# Patient Record
Sex: Female | Born: 1975 | Race: White | Hispanic: No | Marital: Married | Smoking: Never smoker
Health system: Southern US, Community
[De-identification: ages and names within clinical notes are randomized; demographics above are authoritative.]

## PROBLEM LIST (undated history)

## (undated) DIAGNOSIS — K9041 Non-celiac gluten sensitivity: Secondary | ICD-10-CM

## (undated) DIAGNOSIS — IMO0001 Reserved for inherently not codable concepts without codable children: Secondary | ICD-10-CM

## (undated) DIAGNOSIS — K219 Gastro-esophageal reflux disease without esophagitis: Secondary | ICD-10-CM

## (undated) DIAGNOSIS — J9383 Other pneumothorax: Secondary | ICD-10-CM

## (undated) DIAGNOSIS — K589 Irritable bowel syndrome without diarrhea: Secondary | ICD-10-CM

## (undated) DIAGNOSIS — Z8489 Family history of other specified conditions: Secondary | ICD-10-CM

## (undated) DIAGNOSIS — E079 Disorder of thyroid, unspecified: Secondary | ICD-10-CM

## (undated) HISTORY — PX: SALPINGECTOMY: SHX328

## (undated) HISTORY — DX: Non-celiac gluten sensitivity: K90.41

## (undated) HISTORY — DX: Reserved for inherently not codable concepts without codable children: IMO0001

## (undated) HISTORY — PX: HYSTEROSCOPY W/ ENDOMETRIAL ABLATION: SUR665

## (undated) HISTORY — DX: Disorder of thyroid, unspecified: E07.9

## (undated) HISTORY — PX: OTHER SURGICAL HISTORY: SHX169

## (undated) HISTORY — DX: Other pneumothorax: J93.83

## (undated) HISTORY — PX: HERNIA REPAIR: SHX51

## (undated) HISTORY — DX: Family history of other specified conditions: Z84.89

## (undated) HISTORY — PX: TONSILLECTOMY AND ADENOIDECTOMY: SUR1326

## (undated) HISTORY — DX: Irritable bowel syndrome, unspecified: K58.9

## (undated) HISTORY — DX: Gastro-esophageal reflux disease without esophagitis: K21.9

---

## 2008-10-28 ENCOUNTER — Encounter: Payer: Self-pay | Admitting: Cardiology

## 2008-10-28 LAB — CONVERTED CEMR LAB
Albumin: 4.5 g/dL
BUN: 15 mg/dL
CO2, serum: 26 mmol/L
Creatinine, Ser: 0.7 mg/dL
Free T4: 1.12 ng/dL
Hemoglobin: 13.3 g/dL
Potassium, serum: 4 mmol/L
Sodium, serum: 137 mmol/L
TSH: 0.5 microintl units/mL
Total Protein: 7.3 g/dL

## 2008-10-30 ENCOUNTER — Ambulatory Visit: Payer: Self-pay | Admitting: Cardiology

## 2008-10-30 DIAGNOSIS — R42 Dizziness and giddiness: Secondary | ICD-10-CM | POA: Insufficient documentation

## 2008-10-30 DIAGNOSIS — Z8639 Personal history of other endocrine, nutritional and metabolic disease: Secondary | ICD-10-CM

## 2008-10-30 DIAGNOSIS — Z862 Personal history of diseases of the blood and blood-forming organs and certain disorders involving the immune mechanism: Secondary | ICD-10-CM | POA: Insufficient documentation

## 2008-10-30 DIAGNOSIS — R002 Palpitations: Secondary | ICD-10-CM | POA: Insufficient documentation

## 2008-12-06 ENCOUNTER — Encounter: Payer: Self-pay | Admitting: Cardiology

## 2008-12-08 ENCOUNTER — Ambulatory Visit: Payer: Self-pay | Admitting: Cardiology

## 2009-01-07 ENCOUNTER — Ambulatory Visit: Payer: Self-pay | Admitting: Cardiology

## 2011-04-19 ENCOUNTER — Encounter: Payer: Self-pay | Admitting: Cardiology

## 2014-07-09 ENCOUNTER — Encounter (INDEPENDENT_AMBULATORY_CARE_PROVIDER_SITE_OTHER): Payer: Self-pay | Admitting: *Deleted

## 2014-08-03 ENCOUNTER — Telehealth: Payer: Self-pay | Admitting: *Deleted

## 2014-08-03 NOTE — Telephone Encounter (Signed)
Stated she was an old patient of Dr. Arlina Robes - was questioning medication (beta blocker) that he may have given her.  Stated she had been seeing Dr. Derenda Mis & he wanted to know the name.  Advised patient that he had never prescribed beta blocker per our documentation.  Informed her that she would be considered a new patient since last seen 2010.  She can call back if she chooses to re-establish.  Patient verbalized understanding.

## 2014-08-06 ENCOUNTER — Ambulatory Visit (INDEPENDENT_AMBULATORY_CARE_PROVIDER_SITE_OTHER): Payer: Self-pay | Admitting: Internal Medicine

## 2016-10-10 DIAGNOSIS — R5383 Other fatigue: Secondary | ICD-10-CM | POA: Diagnosis not present

## 2016-10-10 DIAGNOSIS — E039 Hypothyroidism, unspecified: Secondary | ICD-10-CM | POA: Diagnosis not present

## 2017-01-24 DIAGNOSIS — Z6824 Body mass index (BMI) 24.0-24.9, adult: Secondary | ICD-10-CM | POA: Diagnosis not present

## 2017-01-24 DIAGNOSIS — Z01419 Encounter for gynecological examination (general) (routine) without abnormal findings: Secondary | ICD-10-CM | POA: Diagnosis not present

## 2017-06-13 DIAGNOSIS — Z1283 Encounter for screening for malignant neoplasm of skin: Secondary | ICD-10-CM | POA: Diagnosis not present

## 2017-06-13 DIAGNOSIS — D225 Melanocytic nevi of trunk: Secondary | ICD-10-CM | POA: Diagnosis not present

## 2018-01-25 DIAGNOSIS — Z6824 Body mass index (BMI) 24.0-24.9, adult: Secondary | ICD-10-CM | POA: Diagnosis not present

## 2018-01-25 DIAGNOSIS — Z01419 Encounter for gynecological examination (general) (routine) without abnormal findings: Secondary | ICD-10-CM | POA: Diagnosis not present

## 2018-01-25 DIAGNOSIS — E039 Hypothyroidism, unspecified: Secondary | ICD-10-CM | POA: Diagnosis not present

## 2018-06-05 DIAGNOSIS — Z1283 Encounter for screening for malignant neoplasm of skin: Secondary | ICD-10-CM | POA: Diagnosis not present

## 2018-06-05 DIAGNOSIS — D225 Melanocytic nevi of trunk: Secondary | ICD-10-CM | POA: Diagnosis not present

## 2018-08-05 DIAGNOSIS — Z6825 Body mass index (BMI) 25.0-25.9, adult: Secondary | ICD-10-CM | POA: Diagnosis not present

## 2018-08-05 DIAGNOSIS — R1011 Right upper quadrant pain: Secondary | ICD-10-CM | POA: Diagnosis not present

## 2018-08-05 DIAGNOSIS — R112 Nausea with vomiting, unspecified: Secondary | ICD-10-CM | POA: Diagnosis not present

## 2018-08-05 DIAGNOSIS — K7689 Other specified diseases of liver: Secondary | ICD-10-CM | POA: Diagnosis not present

## 2018-08-05 DIAGNOSIS — E039 Hypothyroidism, unspecified: Secondary | ICD-10-CM | POA: Diagnosis not present

## 2018-08-06 ENCOUNTER — Other Ambulatory Visit (HOSPITAL_COMMUNITY): Payer: Self-pay | Admitting: Family Medicine

## 2018-08-06 DIAGNOSIS — R1011 Right upper quadrant pain: Secondary | ICD-10-CM

## 2018-08-08 ENCOUNTER — Ambulatory Visit (HOSPITAL_COMMUNITY)
Admission: RE | Admit: 2018-08-08 | Discharge: 2018-08-08 | Disposition: A | Discharge status: Home / Self Care | Payer: BLUE CROSS/BLUE SHIELD | Source: Ambulatory Visit | Attending: Family Medicine | Admitting: Family Medicine

## 2018-08-08 ENCOUNTER — Encounter (HOSPITAL_COMMUNITY): Payer: Self-pay

## 2018-08-08 DIAGNOSIS — R1011 Right upper quadrant pain: Secondary | ICD-10-CM | POA: Diagnosis not present

## 2018-08-08 DIAGNOSIS — R112 Nausea with vomiting, unspecified: Secondary | ICD-10-CM | POA: Diagnosis not present

## 2018-08-08 DIAGNOSIS — R109 Unspecified abdominal pain: Secondary | ICD-10-CM | POA: Diagnosis not present

## 2018-08-08 MED ORDER — TECHNETIUM TC 99M MEBROFENIN IV KIT
5.0000 | PACK | Freq: Once | INTRAVENOUS | Status: AC | PRN
Start: 2018-08-08 — End: 2018-08-08
  Administered 2018-08-08: 5.18 via INTRAVENOUS

## 2018-08-08 MED ORDER — SODIUM CHLORIDE 0.9% FLUSH
INTRAVENOUS | Status: AC
  Filled 2018-08-08: qty 50

## 2018-08-08 MED ORDER — SINCALIDE 5 MCG IJ SOLR
INTRAMUSCULAR | Status: AC
  Administered 2018-08-08: 1.23 ug via INTRAVENOUS
  Filled 2018-08-08: qty 5

## 2018-08-08 MED ORDER — STERILE WATER FOR INJECTION IJ SOLN
INTRAMUSCULAR | Status: AC
  Administered 2018-08-08: 1.23 mL via INTRAVENOUS
  Filled 2018-08-08: qty 10

## 2018-08-30 ENCOUNTER — Encounter: Payer: Self-pay | Admitting: Internal Medicine

## 2018-11-26 ENCOUNTER — Ambulatory Visit (INDEPENDENT_AMBULATORY_CARE_PROVIDER_SITE_OTHER): Payer: BLUE CROSS/BLUE SHIELD | Admitting: Nurse Practitioner

## 2018-11-26 ENCOUNTER — Encounter: Payer: Self-pay | Admitting: Nurse Practitioner

## 2018-11-26 DIAGNOSIS — K9041 Non-celiac gluten sensitivity: Secondary | ICD-10-CM | POA: Diagnosis not present

## 2018-11-26 DIAGNOSIS — K580 Irritable bowel syndrome with diarrhea: Secondary | ICD-10-CM

## 2018-11-26 DIAGNOSIS — K589 Irritable bowel syndrome without diarrhea: Secondary | ICD-10-CM | POA: Insufficient documentation

## 2018-11-26 NOTE — Progress Notes (Signed)
CC'D TO PCP °

## 2018-11-26 NOTE — Assessment & Plan Note (Signed)
Per the patient she was previously tested for celiac disease which was negative.  However, she cut gluten out of her diet during her most recent bout of significant ongoing abdominal pain, diarrhea, nausea, vomiting and this seems to help.  Essentially, by self-imposed elimination diet, she is diagnosed her self with gluten sensitivity.  Being that she is feeling better without gluten I recommend she continue to avoid.  Continue current medications.  The other possible explanation for a flare of her symptoms in October could be self-limiting viral gastroenteritis given that she had nausea, vomiting, diarrhea exacerbation, abdominal pain.  Recommend she continue her current medications, follow-up in 4 months.

## 2018-11-26 NOTE — Progress Notes (Signed)
Primary Care Physician:  Rory Percy, MD Primary Gastroenterologist:  Dr. Gala Romney  Chief Complaint  Patient presents with  . Abdominal Pain    has IBS  . Diarrhea    everyday    HPI:   Charlene Larson is a 43 y.o. female who presents on referral from primary care for abdominal pain.  Reviewed information provided with referral including office visit dated 08/05/2018 with complaints of abdominal pain.  Noted similar symptoms previous September but in the past month has had 4 episodes of severe abdominal pain, nausea, vomiting, diarrhea.  Pain after eating and no appetite, no fevers.  If she lies still pain will pass.  It is diffuse and radiating to the umbilicus.  She was prescribed Zofran for nausea.  Noted right upper quadrant abdominal pain with positive Murphy sign and concern of gallbladder disease so an abdominal ultrasound was ordered.  Brat diet recommended.  Other labs.  Follow-up as needed.  Strong 08/05/2018 found CMP essentially normal, TSH normal, white blood cell count upper limit normal at 10.0, normal hemoglobin of 14.4, CBC overall normal.  HIDA scan found in our system dated 08/08/2018 which found normal gallbladder ejection fraction at 92% (60-minute normal is greater than 40%).  The patient did note sharp pain with injection of CCK.  No history of colonoscopy or endoscopy in our system.  Today she states she's doing ok overall. Has abdominal pain, N/V in September/October of 2019. This resolved. Has chronic IBS. Has been tested for Celiac which was negative but does have Gluten sensitivity. Her September/October symptoms resolved when she cut out Gluten. Has flares of IBS-D and has Bentyl but only takes it if she is having significant problems. Has been taking IBGard regularly which she feels helps. Last couple days has had tenderness intermittently mid-abdomen and right-sided abdomen; otherwise no severe ongoing abdominal pain. No N/V since October. Denies hematochezia,  melena. Has intermittent low grade temp 99-100 degrees. Denies unintentional weight loss other than mild weight when cutting out Gluten. Currently has 3-4 bowel movements a day, typically has morning-time diarrhea then her stools normalize. Denies incontinence or nocturnal stooling. Denies chest pain, dyspnea, dizziness, lightheadedness, syncope, near syncope. Denies any other upper or lower GI symptoms.  Past Medical History:  Diagnosis Date  . Abdominal pain    intermittent; IBS-D  . FHx: allergies    pt does have allergies  . IBS (irritable bowel syndrome)    Diarrhea type  . Non-celiac gluten sensitivity   . Reflux   . Spontaneous pneumothorax    x2  . Thyroid disease     Past Surgical History:  Procedure Laterality Date  . D&C with miscarriage    . HERNIA REPAIR    . HYSTEROSCOPY W/ ENDOMETRIAL ABLATION    . repair of broken jaw    . right hand usrgery    . TONSILLECTOMY AND ADENOIDECTOMY      Current Outpatient Medications  Medication Sig Dispense Refill  . dicyclomine (BENTYL) 10 MG capsule Take 1 capsule by mouth as needed.    . fexofenadine (ALLEGRA) 180 MG tablet Take 180 mg by mouth daily.      . fluticasone (FLONASE) 50 MCG/ACT nasal spray Place 2 sprays into both nostrils daily.    Marland Kitchen levothyroxine (SYNTHROID) 75 MCG tablet Take 75 mcg by mouth. Mon, Wed, Fri    . levothyroxine (SYNTHROID, LEVOTHROID) 50 MCG tablet Take 50 mcg by mouth. Tue, Thu, Sat, Sun    . Lysine 500 MG  CAPS Take by mouth daily.      . Multiple Vitamins-Minerals (MULTIVITAL) tablet Take 1 tablet by mouth daily.      Marland Kitchen Peppermint Oil (IBGARD PO) Take by mouth daily.    . Probiotic Product (PROBIOTIC DAILY PO) Take by mouth daily. 50 billion     No current facility-administered medications for this visit.     Allergies as of 11/26/2018 - Review Complete 11/26/2018  Allergen Reaction Noted  . Rice  11/26/2018  . Itraconazole  10/30/2008    Family History  Problem Relation Age of Onset  .  Obesity Mother   . Asthma Mother   . Colon cancer Maternal Great-grandmother     Social History   Socioeconomic History  . Marital status: Married    Spouse name: Not on file  . Number of children: Not on file  . Years of education: Not on file  . Highest education level: Not on file  Occupational History  . Not on file  Social Needs  . Financial resource strain: Not on file  . Food insecurity:    Worry: Not on file    Inability: Not on file  . Transportation needs:    Medical: Not on file    Non-medical: Not on file  Tobacco Use  . Smoking status: Never Smoker  . Smokeless tobacco: Never Used  Substance and Sexual Activity  . Alcohol use: No  . Drug use: Never  . Sexual activity: Not on file  Lifestyle  . Physical activity:    Days per week: Not on file    Minutes per session: Not on file  . Stress: Not on file  Relationships  . Social connections:    Talks on phone: Not on file    Gets together: Not on file    Attends religious service: Not on file    Active member of club or organization: Not on file    Attends meetings of clubs or organizations: Not on file    Relationship status: Not on file  . Intimate partner violence:    Fear of current or ex partner: Not on file    Emotionally abused: Not on file    Physically abused: Not on file    Forced sexual activity: Not on file  Other Topics Concern  . 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     Review of Systems: General: Negative for anorexia, weight loss, fever, chills, fatigue, weakness. ENT: Negative for hoarseness, difficulty swallowing. CV: Negative for chest pain, angina, palpitations, peripheral edema.  Respiratory: Negative for dyspnea at rest, cough, sputum, wheezing.  GI: See history of present illness. MS: Negative for joint pain, low back pain.  Derm: Negative for rash or itching.  Endo: Negative for unusual weight change.  Heme: Negative for bruising  or bleeding. Allergy: Negative for rash or hives.    Physical Exam: BP 115/74   Pulse 84   Temp (!) 97 F (36.1 C) (Oral)   Ht 5\' 4"  (1.626 m)   Wt 141 lb 9.6 oz (64.2 kg)   BMI 24.31 kg/m  General:   Alert and oriented. Pleasant and cooperative. Well-nourished and well-developed.  Head:  Normocephalic and atraumatic. Eyes:  Without icterus, sclera clear and conjunctiva pink.  Ears:  Normal auditory acuity. Cardiovascular:  S1, S2 present without murmurs appreciated. Extremities without clubbing or edema. Respiratory:  Clear to auscultation bilaterally. No wheezes, rales, or rhonchi. No distress.  Gastrointestinal:  +BS, soft, and non-distended. Mild mid-abdominal TTP. No HSM noted. No guarding or rebound. No masses appreciated.  Rectal:  Deferred  Musculoskalatal:  Symmetrical without gross deformities. Skin:  Intact without significant lesions or rashes. Neurologic:  Alert and oriented x4;  grossly normal neurologically. Psych:  Alert and cooperative. Normal mood and affect. Heme/Lymph/Immune: No excessive bruising noted.    11/26/2018 9:04 AM   Disclaimer: This note was dictated with voice recognition software. Similar sounding words can inadvertently be transcribed and may not be corrected upon review.

## 2018-11-26 NOTE — Patient Instructions (Signed)
Your health issues we discussed today were:   Gluten sensitivity: 1. I am glad you are feeling better after eliminating gluten. 2. Even though your celiac screening was negative, if eliminating gluten helps her symptoms then continue to avoid gluten  Irritable bowel syndrome diarrhea type: 1. I am glad you are feeling better on IBgard. 2. You can continue to use Bentyl as needed. 3. If you have worsening symptoms and need additional medications we can discuss other options including Viberzi  Overall I recommend:  1. Continue your current medications 2. Follow-up in 4 months 3. Call us if you have any questions or concerns.  At [facility] we value your feedback. You may receive a survey about your visit today. Please share your experience as we strive to create trusting relationships with our patients to provide genuine, compassionate, quality care.  We appreciate your understanding and patience as we review any laboratory studies, imaging, and other diagnostic tests that are ordered as we care for you. Our office policy is 5 business days for review of these results, and any emergent or urgent results are addressed in a timely manner for your best interest. If you do not hear from our office in 1 week, please contact us.   We also encourage the use of MyChart, which contains your medical information for your review as well. If you are not enrolled in this feature, an access code is on this after visit summary for your convenience. Thank you for allowing [place] to be involved in your care.  It was great to see you today!  I hope you have a great day!!

## 2018-11-26 NOTE — Assessment & Plan Note (Signed)
The patient has a history of IBS diarrhea type.  She has Bentyl on hand that she only uses if she is having significant symptoms.  Otherwise she prefers to treat with IBgard which she feels has helped her symptoms a lot.  She does have about 2-4 stools a day typically diarrhea in the morning and then normal stools later in the day.  Intermittent abdominal pain.  Her previous bout of abdominal pain has resolved and thinks it was related to gluten, which she is cut out.  At this point she could be a candidate for Viberzi if additional options are needed.  Otherwise, continue current medications and call us for any worsening symptoms.  Follow-up in 4 months.

## 2018-12-04 DIAGNOSIS — L820 Inflamed seborrheic keratosis: Secondary | ICD-10-CM | POA: Diagnosis not present

## 2018-12-04 DIAGNOSIS — Z1283 Encounter for screening for malignant neoplasm of skin: Secondary | ICD-10-CM | POA: Diagnosis not present

## 2018-12-04 DIAGNOSIS — L298 Other pruritus: Secondary | ICD-10-CM | POA: Diagnosis not present

## 2018-12-04 DIAGNOSIS — L821 Other seborrheic keratosis: Secondary | ICD-10-CM | POA: Diagnosis not present

## 2018-12-04 DIAGNOSIS — D225 Melanocytic nevi of trunk: Secondary | ICD-10-CM | POA: Diagnosis not present

## 2018-12-10 DIAGNOSIS — R899 Unspecified abnormal finding in specimens from other organs, systems and tissues: Secondary | ICD-10-CM | POA: Diagnosis not present

## 2018-12-10 DIAGNOSIS — Z6825 Body mass index (BMI) 25.0-25.9, adult: Secondary | ICD-10-CM | POA: Diagnosis not present

## 2018-12-19 DIAGNOSIS — M6281 Muscle weakness (generalized): Secondary | ICD-10-CM | POA: Diagnosis not present

## 2018-12-19 DIAGNOSIS — R5382 Chronic fatigue, unspecified: Secondary | ICD-10-CM | POA: Diagnosis not present

## 2018-12-19 DIAGNOSIS — R768 Other specified abnormal immunological findings in serum: Secondary | ICD-10-CM | POA: Diagnosis not present

## 2019-03-27 ENCOUNTER — Ambulatory Visit: Payer: BLUE CROSS/BLUE SHIELD | Admitting: Nurse Practitioner

## 2019-07-07 DIAGNOSIS — Z23 Encounter for immunization: Secondary | ICD-10-CM | POA: Diagnosis not present

## 2019-09-09 DIAGNOSIS — Z6825 Body mass index (BMI) 25.0-25.9, adult: Secondary | ICD-10-CM | POA: Diagnosis not present

## 2019-09-09 DIAGNOSIS — E039 Hypothyroidism, unspecified: Secondary | ICD-10-CM | POA: Diagnosis not present

## 2019-09-09 DIAGNOSIS — Z01419 Encounter for gynecological examination (general) (routine) without abnormal findings: Secondary | ICD-10-CM | POA: Diagnosis not present

## 2019-09-09 DIAGNOSIS — E559 Vitamin D deficiency, unspecified: Secondary | ICD-10-CM | POA: Diagnosis not present

## 2019-11-12 DIAGNOSIS — H6591 Unspecified nonsuppurative otitis media, right ear: Secondary | ICD-10-CM | POA: Diagnosis not present

## 2019-12-02 DIAGNOSIS — E559 Vitamin D deficiency, unspecified: Secondary | ICD-10-CM | POA: Diagnosis not present

## 2020-04-28 DIAGNOSIS — Z6826 Body mass index (BMI) 26.0-26.9, adult: Secondary | ICD-10-CM | POA: Diagnosis not present

## 2020-04-28 DIAGNOSIS — R42 Dizziness and giddiness: Secondary | ICD-10-CM | POA: Diagnosis not present

## 2020-04-28 DIAGNOSIS — E611 Iron deficiency: Secondary | ICD-10-CM | POA: Diagnosis not present

## 2020-04-28 DIAGNOSIS — E039 Hypothyroidism, unspecified: Secondary | ICD-10-CM | POA: Diagnosis not present

## 2020-04-28 DIAGNOSIS — H6121 Impacted cerumen, right ear: Secondary | ICD-10-CM | POA: Diagnosis not present

## 2020-08-10 DIAGNOSIS — Z23 Encounter for immunization: Secondary | ICD-10-CM | POA: Diagnosis not present

## 2020-09-13 DIAGNOSIS — Z01419 Encounter for gynecological examination (general) (routine) without abnormal findings: Secondary | ICD-10-CM | POA: Diagnosis not present

## 2020-09-13 DIAGNOSIS — E039 Hypothyroidism, unspecified: Secondary | ICD-10-CM | POA: Diagnosis not present

## 2020-09-13 DIAGNOSIS — E559 Vitamin D deficiency, unspecified: Secondary | ICD-10-CM | POA: Diagnosis not present

## 2020-09-13 DIAGNOSIS — Z6825 Body mass index (BMI) 25.0-25.9, adult: Secondary | ICD-10-CM | POA: Diagnosis not present

## 2020-09-30 ENCOUNTER — Other Ambulatory Visit: Payer: Self-pay | Admitting: Obstetrics and Gynecology

## 2020-09-30 DIAGNOSIS — Z1231 Encounter for screening mammogram for malignant neoplasm of breast: Secondary | ICD-10-CM

## 2020-10-12 ENCOUNTER — Ambulatory Visit: Payer: BLUE CROSS/BLUE SHIELD

## 2020-11-18 ENCOUNTER — Other Ambulatory Visit: Payer: Self-pay

## 2020-11-18 ENCOUNTER — Ambulatory Visit
Admission: RE | Admit: 2020-11-18 | Discharge: 2020-11-18 | Disposition: A | Discharge status: Home / Self Care | Payer: BC Managed Care – PPO | Source: Ambulatory Visit | Attending: Obstetrics and Gynecology | Admitting: Obstetrics and Gynecology

## 2020-11-18 DIAGNOSIS — Z1231 Encounter for screening mammogram for malignant neoplasm of breast: Secondary | ICD-10-CM

## 2020-11-22 ENCOUNTER — Other Ambulatory Visit: Payer: Self-pay | Admitting: Obstetrics and Gynecology

## 2020-11-22 DIAGNOSIS — R928 Other abnormal and inconclusive findings on diagnostic imaging of breast: Secondary | ICD-10-CM

## 2020-12-03 ENCOUNTER — Other Ambulatory Visit: Payer: Self-pay

## 2020-12-03 ENCOUNTER — Ambulatory Visit
Admission: RE | Admit: 2020-12-03 | Discharge: 2020-12-03 | Disposition: A | Discharge status: Home / Self Care | Payer: BC Managed Care – PPO | Source: Ambulatory Visit | Attending: Obstetrics and Gynecology | Admitting: Obstetrics and Gynecology

## 2020-12-03 ENCOUNTER — Other Ambulatory Visit: Payer: Self-pay | Admitting: Obstetrics and Gynecology

## 2020-12-03 DIAGNOSIS — R928 Other abnormal and inconclusive findings on diagnostic imaging of breast: Secondary | ICD-10-CM

## 2021-06-03 ENCOUNTER — Other Ambulatory Visit: Payer: Self-pay | Admitting: Obstetrics and Gynecology

## 2021-06-03 ENCOUNTER — Ambulatory Visit
Admission: RE | Admit: 2021-06-03 | Discharge: 2021-06-03 | Disposition: A | Discharge status: Home / Self Care | Payer: BC Managed Care – PPO | Source: Ambulatory Visit | Attending: Obstetrics and Gynecology | Admitting: Obstetrics and Gynecology

## 2021-06-03 ENCOUNTER — Other Ambulatory Visit: Payer: Self-pay

## 2021-06-03 DIAGNOSIS — R928 Other abnormal and inconclusive findings on diagnostic imaging of breast: Secondary | ICD-10-CM

## 2021-07-13 DIAGNOSIS — E559 Vitamin D deficiency, unspecified: Secondary | ICD-10-CM | POA: Diagnosis not present

## 2021-07-13 DIAGNOSIS — E039 Hypothyroidism, unspecified: Secondary | ICD-10-CM | POA: Diagnosis not present

## 2021-07-13 DIAGNOSIS — R5383 Other fatigue: Secondary | ICD-10-CM | POA: Diagnosis not present

## 2021-09-14 DIAGNOSIS — R946 Abnormal results of thyroid function studies: Secondary | ICD-10-CM | POA: Diagnosis not present

## 2021-09-14 DIAGNOSIS — R5383 Other fatigue: Secondary | ICD-10-CM | POA: Diagnosis not present

## 2021-09-14 DIAGNOSIS — Z793 Long term (current) use of hormonal contraceptives: Secondary | ICD-10-CM | POA: Diagnosis not present

## 2021-09-14 DIAGNOSIS — Z6825 Body mass index (BMI) 25.0-25.9, adult: Secondary | ICD-10-CM | POA: Diagnosis not present

## 2021-09-14 DIAGNOSIS — Z01419 Encounter for gynecological examination (general) (routine) without abnormal findings: Secondary | ICD-10-CM | POA: Diagnosis not present

## 2021-09-14 DIAGNOSIS — E039 Hypothyroidism, unspecified: Secondary | ICD-10-CM | POA: Diagnosis not present

## 2021-12-09 ENCOUNTER — Ambulatory Visit
Admission: RE | Admit: 2021-12-09 | Discharge: 2021-12-09 | Disposition: A | Discharge status: Home / Self Care | Payer: BC Managed Care – PPO | Source: Ambulatory Visit | Attending: Obstetrics and Gynecology | Admitting: Obstetrics and Gynecology

## 2021-12-09 ENCOUNTER — Other Ambulatory Visit: Payer: Self-pay

## 2021-12-09 DIAGNOSIS — R928 Other abnormal and inconclusive findings on diagnostic imaging of breast: Secondary | ICD-10-CM

## 2021-12-09 DIAGNOSIS — R922 Inconclusive mammogram: Secondary | ICD-10-CM | POA: Diagnosis not present

## 2022-01-13 DIAGNOSIS — M2550 Pain in unspecified joint: Secondary | ICD-10-CM | POA: Diagnosis not present

## 2022-01-13 DIAGNOSIS — S30860A Insect bite (nonvenomous) of lower back and pelvis, initial encounter: Secondary | ICD-10-CM | POA: Diagnosis not present

## 2022-01-13 DIAGNOSIS — I10 Essential (primary) hypertension: Secondary | ICD-10-CM | POA: Diagnosis not present

## 2022-01-13 DIAGNOSIS — E039 Hypothyroidism, unspecified: Secondary | ICD-10-CM | POA: Diagnosis not present

## 2022-01-13 DIAGNOSIS — N926 Irregular menstruation, unspecified: Secondary | ICD-10-CM | POA: Diagnosis not present

## 2022-01-13 DIAGNOSIS — R5383 Other fatigue: Secondary | ICD-10-CM | POA: Diagnosis not present

## 2022-04-03 DIAGNOSIS — J069 Acute upper respiratory infection, unspecified: Secondary | ICD-10-CM | POA: Diagnosis not present

## 2022-04-03 DIAGNOSIS — Z6826 Body mass index (BMI) 26.0-26.9, adult: Secondary | ICD-10-CM | POA: Diagnosis not present

## 2022-04-03 DIAGNOSIS — J029 Acute pharyngitis, unspecified: Secondary | ICD-10-CM | POA: Diagnosis not present

## 2022-06-06 ENCOUNTER — Other Ambulatory Visit: Payer: Self-pay | Admitting: Obstetrics and Gynecology

## 2022-06-06 DIAGNOSIS — R921 Mammographic calcification found on diagnostic imaging of breast: Secondary | ICD-10-CM

## 2022-06-16 DIAGNOSIS — Z23 Encounter for immunization: Secondary | ICD-10-CM | POA: Diagnosis not present

## 2022-07-25 DIAGNOSIS — R109 Unspecified abdominal pain: Secondary | ICD-10-CM | POA: Diagnosis not present

## 2022-07-25 DIAGNOSIS — K589 Irritable bowel syndrome without diarrhea: Secondary | ICD-10-CM | POA: Diagnosis not present

## 2022-07-25 DIAGNOSIS — Z6825 Body mass index (BMI) 25.0-25.9, adult: Secondary | ICD-10-CM | POA: Diagnosis not present

## 2022-07-25 DIAGNOSIS — Z23 Encounter for immunization: Secondary | ICD-10-CM | POA: Diagnosis not present

## 2022-07-25 DIAGNOSIS — K625 Hemorrhage of anus and rectum: Secondary | ICD-10-CM | POA: Diagnosis not present

## 2022-08-08 IMAGING — MG DIGITAL DIAGNOSTIC BILAT W/ TOMO W/ CAD
8 of 17 series · 8 of 33 positions shown · non-contrast
Comparison: Recent screening mammography

CLINICAL DATA: The patient was called back for multiple groups of
bilateral breast calcifications and multiple bilateral breast
masses.

EXAM:
DIGITAL DIAGNOSTIC BILATERAL MAMMOGRAM WITH TOMOSYNTHESIS AND CAD;
ULTRASOUND LEFT BREAST LIMITED; ULTRASOUND RIGHT BREAST LIMITED
TECHNIQUE: Bilateral digital diagnostic mammography and breast tomosynthesis
was performed. The images were evaluated with computer-aided
detection.; Targeted ultrasound examination of the left breast was
performed. ; Targeted ultrasound examination of the right breast was
performed.

[L ML (1 of 3)]
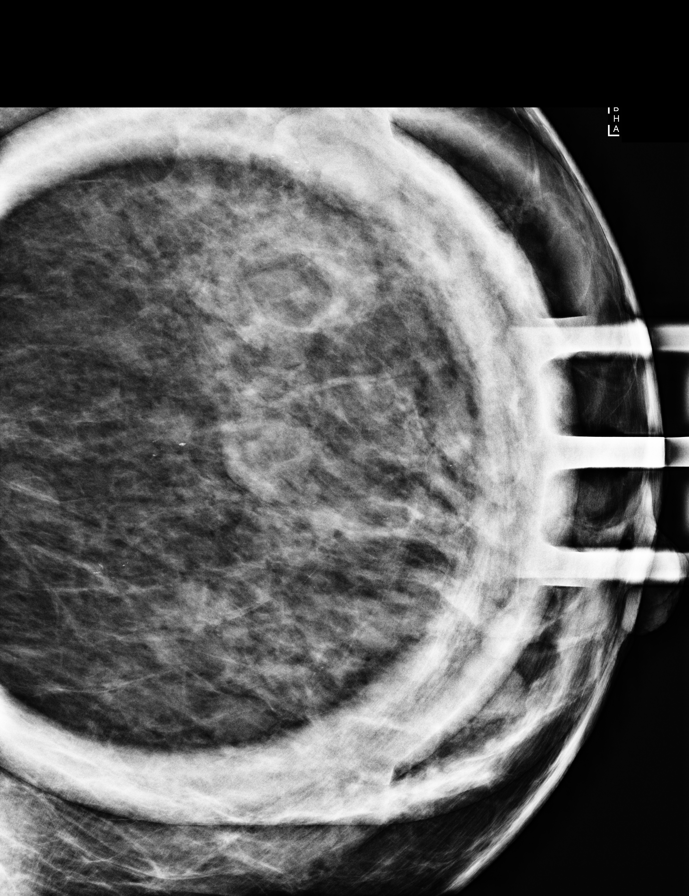

[L CC (1 of 2)]
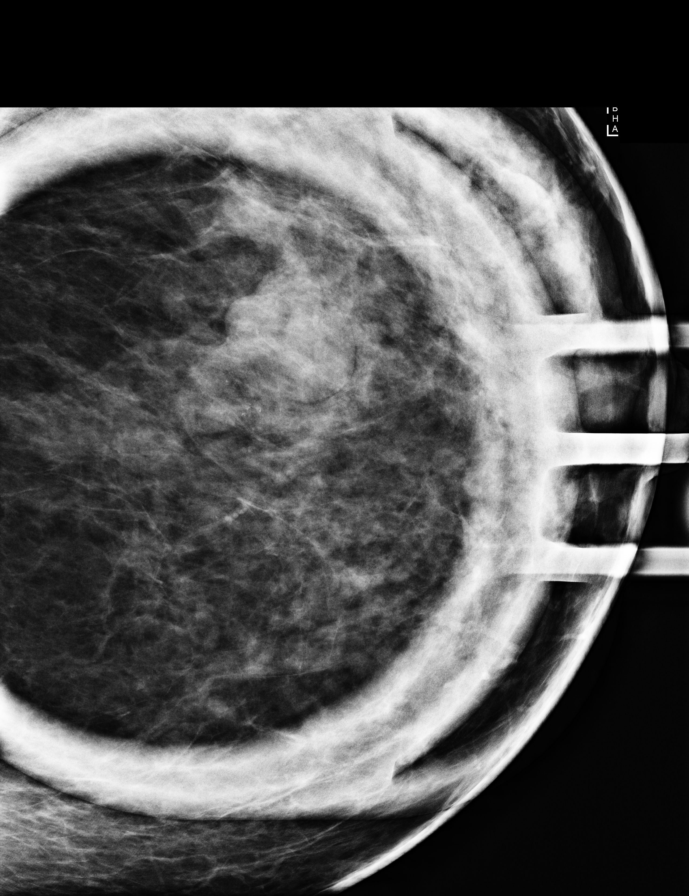

[L ML (2 of 3)]
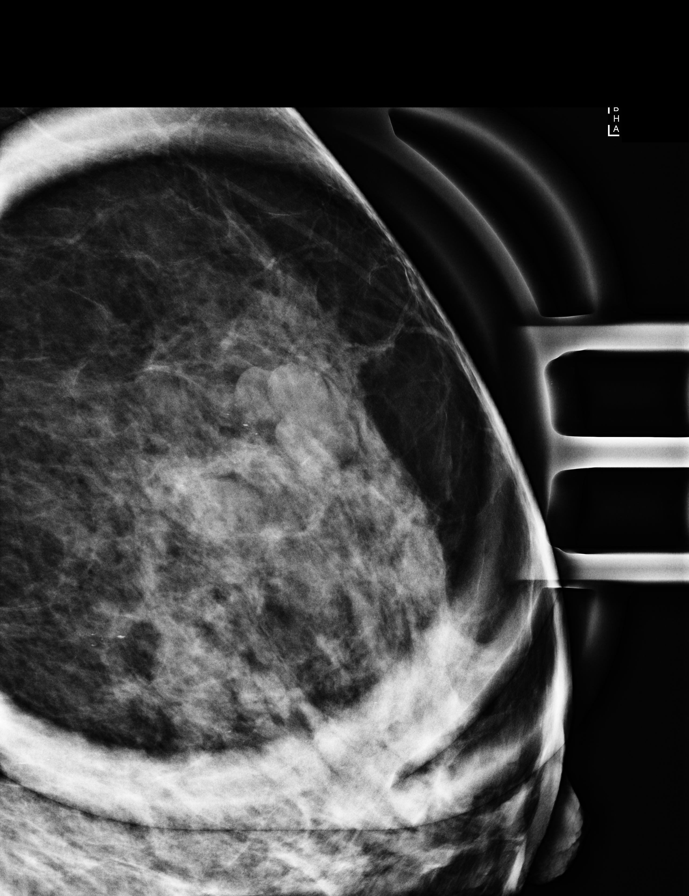

[R CC (1 of 2)]
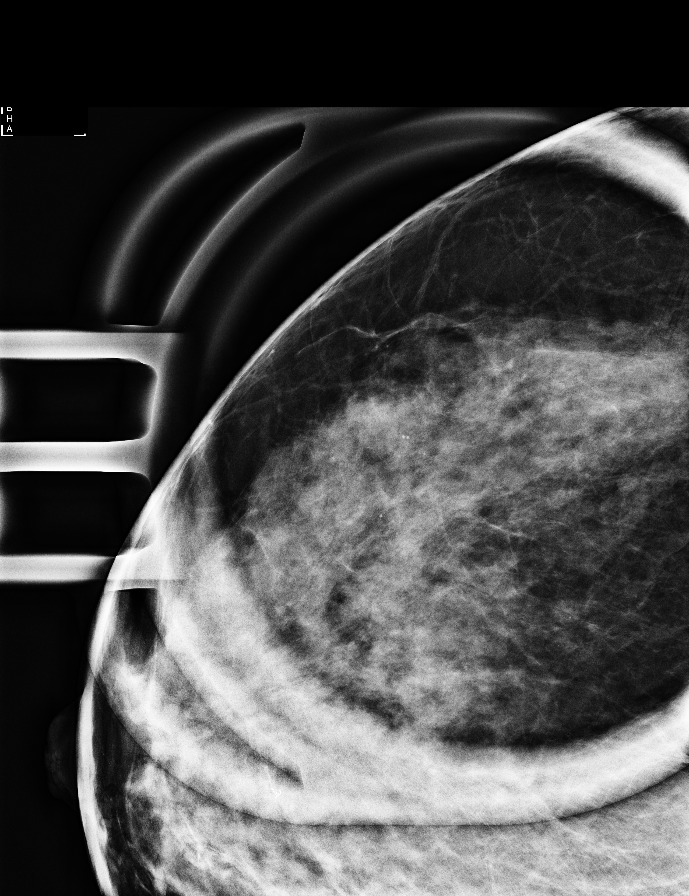

[R ML]
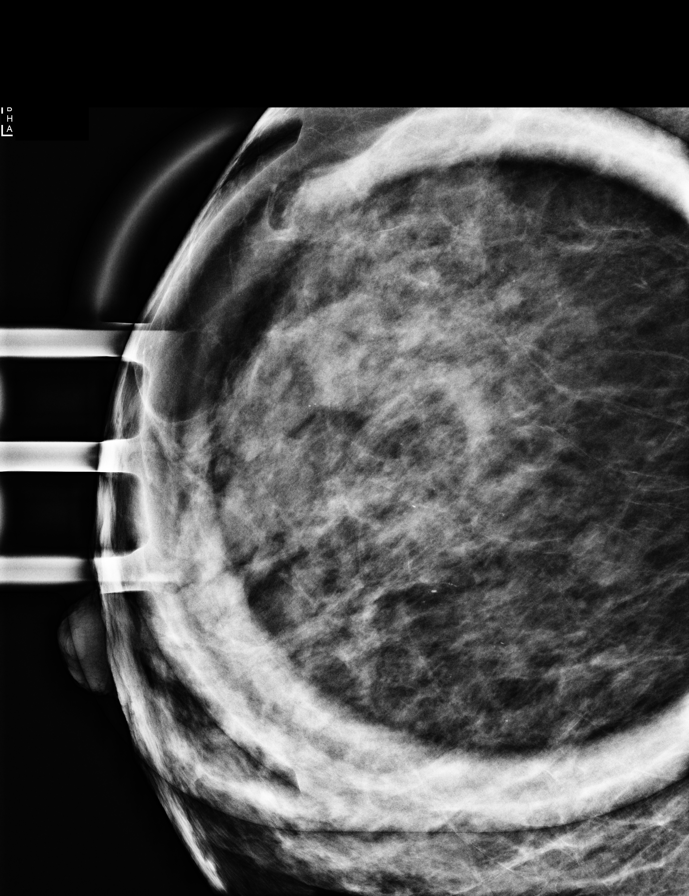

[L ML (3 of 3)]
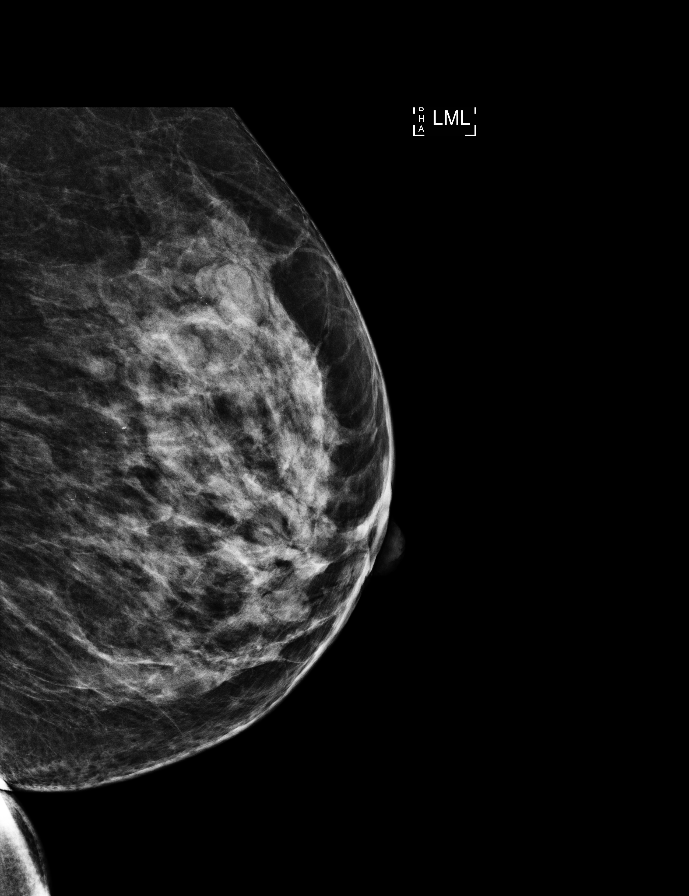

[L CC (2 of 2)]
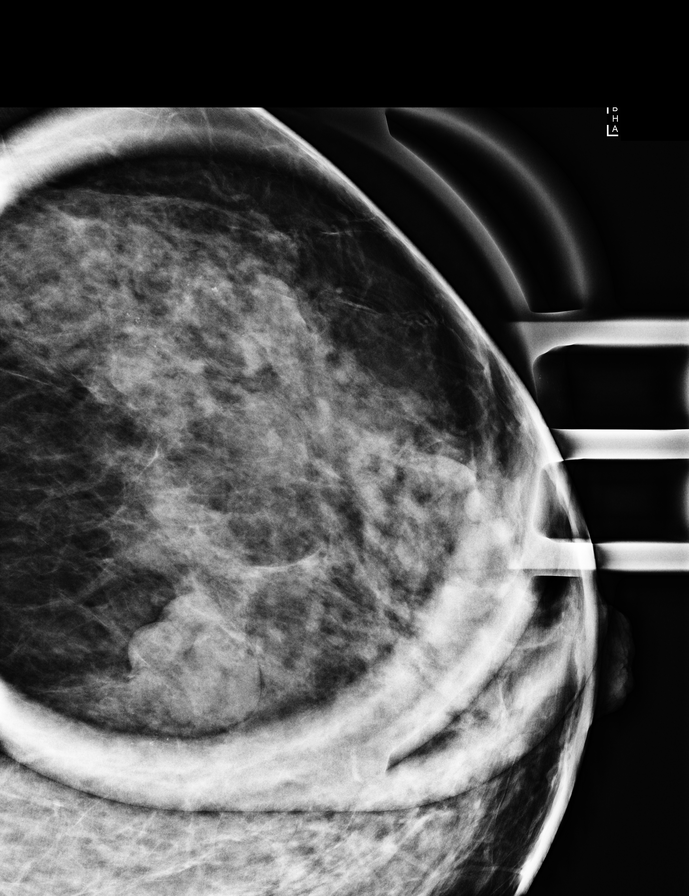

[R CC (2 of 2)]
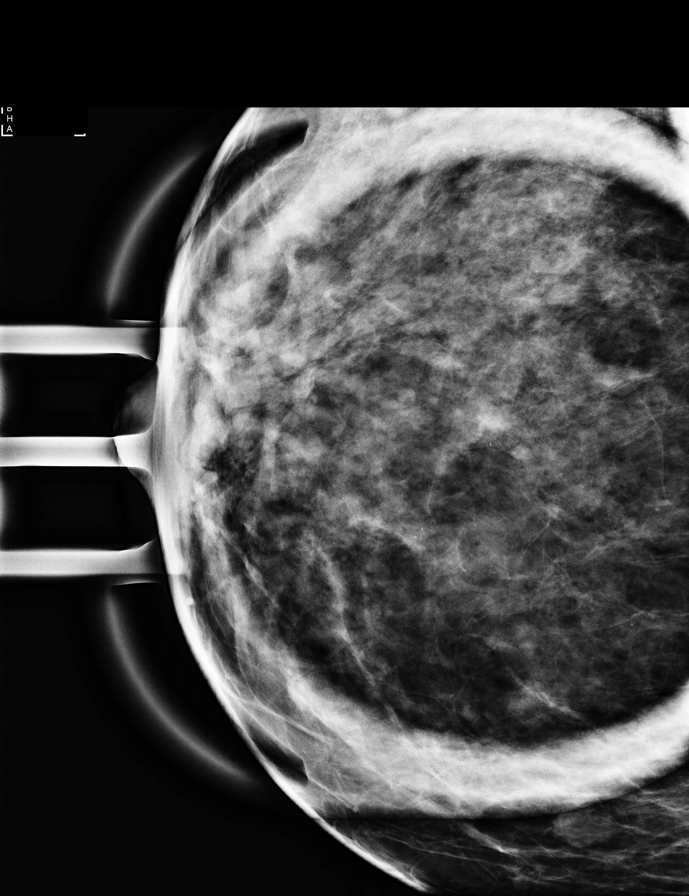

[8 of 33 positions shown; findings below may reference images not displayed]

ACR Breast Density Category c: The breast tissue is heterogeneously
dense, which may obscure small masses.
FINDINGS: The calcifications in both the lateral and medial aspects of the
right breast are probably benign. Some of these calcifications
appear to layer. There are 2 masses in the medial right breast which
are isodense and well-circumscribed.

The calcifications in the lateral and central left breast are
probably benign. Some of these calcifications appear to layer. There
are multiple masses in the left breast located from [DATE] to [DATE].

On physical exam, no suspicious lumps are identified.

Targeted ultrasound is performed, showing 2 masses in the right
breast. The hypoechoic mass at 2 o'clock, 6 cm from the nipple
measures 8 by 6 x 3 mm, likely a fibroadenoma. There is also a
simple cyst at 3 o'clock.

Numerous cysts are seen throughout the left breast accounting for
mammographic findings. There is also a single solid probably benign
mass at [DATE], 5 cm from the nipple measuring 9 x 7 x 3 mm.
IMPRESSION: Fibrocystic changes. There is a probably benign mass in each breast.
There are probably benign calcifications in each breast.

RECOMMENDATION:
Recommend six-month follow-up mammogram of the probably benign
calcifications and six-month follow-up ultrasound of the probably
benign masses.

I have discussed the findings and recommendations with the patient.
If applicable, a reminder letter will be sent to the patient
regarding the next appointment.

BI-RADS CATEGORY  3: Probably benign.

## 2022-08-28 DIAGNOSIS — Z1283 Encounter for screening for malignant neoplasm of skin: Secondary | ICD-10-CM | POA: Diagnosis not present

## 2022-08-28 DIAGNOSIS — D225 Melanocytic nevi of trunk: Secondary | ICD-10-CM | POA: Diagnosis not present

## 2022-08-29 NOTE — Progress Notes (Unsigned)
GI Office Note    Referring Provider: Rory Percy, MD Primary Care Physician:  Manon Hilding, MD  Primary Gastroenterologist: Garfield Cornea, MD   Chief Complaint   No chief complaint on file.    History of Present Illness   Charlene Larson is a 46 y.o. female presenting today          Medications   Current Outpatient Medications  Medication Sig Dispense Refill   dicyclomine (BENTYL) 10 MG capsule Take 1 capsule by mouth as needed.     fexofenadine (ALLEGRA) 180 MG tablet Take 180 mg by mouth daily.       fluticasone (FLONASE) 50 MCG/ACT nasal spray Place 2 sprays into both nostrils daily.     levothyroxine (SYNTHROID) 75 MCG tablet Take 75 mcg by mouth. Mon, Wed, Fri     levothyroxine (SYNTHROID, LEVOTHROID) 50 MCG tablet Take 50 mcg by mouth. Tue, Thu, Sat, Sun     Lysine 500 MG CAPS Take by mouth daily.       Multiple Vitamins-Minerals (MULTIVITAL) tablet Take 1 tablet by mouth daily.       Peppermint Oil (IBGARD PO) Take by mouth daily.     Probiotic Product (PROBIOTIC DAILY PO) Take by mouth daily. 50 billion     No current facility-administered medications for this visit.    Allergies   Allergies as of 08/30/2022 - Review Complete 11/26/2018  Allergen Reaction Noted   Rice  11/26/2018   Itraconazole  10/30/2008    Past Medical History   Past Medical History:  Diagnosis Date   Abdominal pain    intermittent; IBS-D   FHx: allergies    pt does have allergies   IBS (irritable bowel syndrome)    Diarrhea type   Non-celiac gluten sensitivity    Reflux    Spontaneous pneumothorax    x2   Thyroid disease     Past Surgical History   Past Surgical History:  Procedure Laterality Date   D&C with miscarriage     HERNIA REPAIR     HYSTEROSCOPY W/ ENDOMETRIAL ABLATION     repair of broken jaw     right hand usrgery     TONSILLECTOMY AND ADENOIDECTOMY      Past Family History   Family History  Problem Relation Age of Onset   Obesity  Mother    Asthma Mother    Colon cancer Maternal Great-grandmother     Past Social History   Social History   Socioeconomic History   Marital status: Married    Spouse name: Not on file   Number of children: Not on file   Years of education: Not on file   Highest education level: Not on file  Occupational History   Not on file  Tobacco Use   Smoking status: Never   Smokeless tobacco: Never  Substance and Sexual Activity   Alcohol use: No   Drug use: Never   Sexual activity: Not on file  Other Topics Concern   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    Social Determinants of Health   Financial Resource Strain: Not on file  Food Insecurity: Not on file  Transportation Needs: Not on file  Physical Activity: Not on file  Stress: Not on file  Social Connections: Not on file  Intimate Partner Violence: Not on file    Review of Systems   General: Negative for anorexia, weight  loss, fever, chills, fatigue, weakness. Eyes: Negative for vision changes.  ENT: Negative for hoarseness, difficulty swallowing , nasal congestion. CV: Negative for chest pain, angina, palpitations, dyspnea on exertion, peripheral edema.  Respiratory: Negative for dyspnea at rest, dyspnea on exertion, cough, sputum, wheezing.  GI: See history of present illness. GU:  Negative for dysuria, hematuria, urinary incontinence, urinary frequency, nocturnal urination.  MS: Negative for joint pain, low back pain.  Derm: Negative for rash or itching.  Neuro: Negative for weakness, abnormal sensation, seizure, frequent headaches, memory loss,  confusion.  Psych: Negative for anxiety, depression, suicidal ideation, hallucinations.  Endo: Negative for unusual weight change.  Heme: Negative for bruising or bleeding. Allergy: Negative for rash or hives.  Physical Exam   There were no vitals taken for this visit.   General: Well-nourished, well-developed in no acute  distress.  Head: Normocephalic, atraumatic.   Eyes: Conjunctiva pink, no icterus. Mouth: Oropharyngeal mucosa moist and pink , no lesions erythema or exudate. Neck: Supple without thyromegaly, masses, or lymphadenopathy.  Lungs: Clear to auscultation bilaterally.  Heart: Regular rate and rhythm, no murmurs rubs or gallops.  Abdomen: Bowel sounds are normal, nontender, nondistended, no hepatosplenomegaly or masses,  no abdominal bruits or hernia, no rebound or guarding.   Rectal: *** Extremities: No lower extremity edema. No clubbing or deformities.  Neuro: Alert and oriented x 4 , grossly normal neurologically.  Skin: Warm and dry, no rash or jaundice.   Psych: Alert and cooperative, normal mood and affect.  Labs   *** Imaging Studies   No results found.  Assessment       PLAN   ***   Laureen Ochs. Bobby Rumpf, [place], [facility]

## 2022-08-30 ENCOUNTER — Ambulatory Visit (INDEPENDENT_AMBULATORY_CARE_PROVIDER_SITE_OTHER): Payer: BC Managed Care – PPO | Admitting: Gastroenterology

## 2022-08-30 ENCOUNTER — Encounter: Payer: Self-pay | Admitting: Gastroenterology

## 2022-08-30 VITALS — BP 115/77 | HR 87 | Temp 98.3°F | Ht 64.0 in | Wt 148.6 lb

## 2022-08-30 DIAGNOSIS — K529 Noninfective gastroenteritis and colitis, unspecified: Secondary | ICD-10-CM | POA: Insufficient documentation

## 2022-08-30 DIAGNOSIS — R1031 Right lower quadrant pain: Secondary | ICD-10-CM

## 2022-08-30 DIAGNOSIS — K625 Hemorrhage of anus and rectum: Secondary | ICD-10-CM | POA: Diagnosis not present

## 2022-08-30 DIAGNOSIS — G8929 Other chronic pain: Secondary | ICD-10-CM

## 2022-08-30 MED ORDER — ONDANSETRON HCL 4 MG PO TABS: 4.0000 mg | ORAL_TABLET | Freq: Four times a day (QID) | ORAL | 1 refills | Status: DC | PRN

## 2022-08-30 NOTE — Patient Instructions (Signed)
RX for Zofran sent to pharmacy for nausea.  Colonoscopy in the near future. See separate instructions.

## 2022-08-31 ENCOUNTER — Other Ambulatory Visit: Payer: Self-pay | Admitting: *Deleted

## 2022-08-31 ENCOUNTER — Encounter: Payer: Self-pay | Admitting: *Deleted

## 2022-08-31 DIAGNOSIS — G8929 Other chronic pain: Secondary | ICD-10-CM

## 2022-08-31 DIAGNOSIS — K625 Hemorrhage of anus and rectum: Secondary | ICD-10-CM

## 2022-08-31 MED ORDER — PEG 3350-KCL-NA BICARB-NACL 420 G PO SOLR: 4000.0000 mL | Freq: Once | ORAL | 0 refills | Status: AC

## 2022-09-19 DIAGNOSIS — B3731 Acute candidiasis of vulva and vagina: Secondary | ICD-10-CM | POA: Diagnosis not present

## 2022-09-19 DIAGNOSIS — Z01419 Encounter for gynecological examination (general) (routine) without abnormal findings: Secondary | ICD-10-CM | POA: Diagnosis not present

## 2022-09-19 DIAGNOSIS — Z8639 Personal history of other endocrine, nutritional and metabolic disease: Secondary | ICD-10-CM | POA: Diagnosis not present

## 2022-09-19 DIAGNOSIS — E039 Hypothyroidism, unspecified: Secondary | ICD-10-CM | POA: Diagnosis not present

## 2022-10-11 ENCOUNTER — Other Ambulatory Visit (HOSPITAL_COMMUNITY)
Admission: RE | Admit: 2022-10-11 | Discharge: 2022-10-11 | Disposition: A | Discharge status: Home / Self Care | Payer: BC Managed Care – PPO | Source: Ambulatory Visit | Attending: Gastroenterology | Admitting: Gastroenterology

## 2022-10-11 DIAGNOSIS — K648 Other hemorrhoids: Secondary | ICD-10-CM | POA: Diagnosis not present

## 2022-10-11 DIAGNOSIS — D120 Benign neoplasm of cecum: Secondary | ICD-10-CM | POA: Diagnosis not present

## 2022-10-11 DIAGNOSIS — R1031 Right lower quadrant pain: Secondary | ICD-10-CM | POA: Insufficient documentation

## 2022-10-11 DIAGNOSIS — G8929 Other chronic pain: Secondary | ICD-10-CM | POA: Insufficient documentation

## 2022-10-11 DIAGNOSIS — E039 Hypothyroidism, unspecified: Secondary | ICD-10-CM | POA: Diagnosis not present

## 2022-10-11 DIAGNOSIS — K589 Irritable bowel syndrome without diarrhea: Secondary | ICD-10-CM | POA: Diagnosis not present

## 2022-10-11 DIAGNOSIS — K625 Hemorrhage of anus and rectum: Secondary | ICD-10-CM | POA: Insufficient documentation

## 2022-10-11 DIAGNOSIS — K529 Noninfective gastroenteritis and colitis, unspecified: Secondary | ICD-10-CM | POA: Diagnosis not present

## 2022-10-11 DIAGNOSIS — D122 Benign neoplasm of ascending colon: Secondary | ICD-10-CM | POA: Diagnosis not present

## 2022-10-11 LAB — PREGNANCY, URINE: Preg Test, Ur: NEGATIVE

## 2022-10-13 ENCOUNTER — Ambulatory Visit (HOSPITAL_COMMUNITY)
Admission: RE | Admit: 2022-10-13 | Discharge: 2022-10-13 | Disposition: A | Discharge status: Home / Self Care | Payer: BC Managed Care – PPO | Attending: Gastroenterology | Admitting: Gastroenterology

## 2022-10-13 ENCOUNTER — Ambulatory Visit (HOSPITAL_COMMUNITY): Payer: BC Managed Care – PPO | Admitting: Anesthesiology

## 2022-10-13 ENCOUNTER — Encounter (HOSPITAL_COMMUNITY): Payer: Self-pay | Admitting: Gastroenterology

## 2022-10-13 ENCOUNTER — Encounter (HOSPITAL_COMMUNITY)
Admission: RE | Disposition: A | Discharge status: Home / Self Care | Payer: Self-pay | Source: Home / Self Care | Attending: Gastroenterology

## 2022-10-13 ENCOUNTER — Other Ambulatory Visit: Payer: Self-pay

## 2022-10-13 DIAGNOSIS — K625 Hemorrhage of anus and rectum: Secondary | ICD-10-CM | POA: Diagnosis not present

## 2022-10-13 DIAGNOSIS — D122 Benign neoplasm of ascending colon: Secondary | ICD-10-CM | POA: Diagnosis not present

## 2022-10-13 DIAGNOSIS — K589 Irritable bowel syndrome without diarrhea: Secondary | ICD-10-CM | POA: Diagnosis not present

## 2022-10-13 DIAGNOSIS — K635 Polyp of colon: Secondary | ICD-10-CM | POA: Diagnosis not present

## 2022-10-13 DIAGNOSIS — K648 Other hemorrhoids: Secondary | ICD-10-CM

## 2022-10-13 DIAGNOSIS — E039 Hypothyroidism, unspecified: Secondary | ICD-10-CM | POA: Insufficient documentation

## 2022-10-13 DIAGNOSIS — K529 Noninfective gastroenteritis and colitis, unspecified: Secondary | ICD-10-CM | POA: Diagnosis not present

## 2022-10-13 DIAGNOSIS — R1031 Right lower quadrant pain: Secondary | ICD-10-CM | POA: Diagnosis not present

## 2022-10-13 DIAGNOSIS — D120 Benign neoplasm of cecum: Secondary | ICD-10-CM | POA: Insufficient documentation

## 2022-10-13 DIAGNOSIS — G8929 Other chronic pain: Secondary | ICD-10-CM

## 2022-10-13 HISTORY — PX: POLYPECTOMY: SHX149

## 2022-10-13 HISTORY — PX: BIOPSY: SHX5522

## 2022-10-13 HISTORY — PX: COLONOSCOPY WITH PROPOFOL: SHX5780

## 2022-10-13 LAB — HM COLONOSCOPY

## 2022-10-13 SURGERY — COLONOSCOPY WITH PROPOFOL
Anesthesia: General

## 2022-10-13 MED ORDER — HYOSCYAMINE SULFATE 0.125 MG SL SUBL: 0.1250 mg | SUBLINGUAL_TABLET | Freq: Four times a day (QID) | SUBLINGUAL | 2 refills | Status: DC | PRN

## 2022-10-13 MED ORDER — PROPOFOL 10 MG/ML IV BOLUS
INTRAVENOUS | Status: DC | PRN
  Administered 2022-10-13: 70 mg via INTRAVENOUS
  Administered 2022-10-13: 30 mg via INTRAVENOUS

## 2022-10-13 MED ORDER — LIDOCAINE HCL (CARDIAC) PF 100 MG/5ML IV SOSY
PREFILLED_SYRINGE | INTRAVENOUS | Status: DC | PRN
  Administered 2022-10-13: 50 mg via INTRATRACHEAL

## 2022-10-13 MED ORDER — STERILE WATER FOR IRRIGATION IR SOLN
Status: DC | PRN
  Administered 2022-10-13 (×2): 60 mL

## 2022-10-13 MED ORDER — LACTATED RINGERS IV SOLN: INTRAVENOUS | Status: DC

## 2022-10-13 MED ORDER — PROPOFOL 500 MG/50ML IV EMUL
INTRAVENOUS | Status: DC | PRN
  Administered 2022-10-13: 200 ug/kg/min via INTRAVENOUS

## 2022-10-13 NOTE — Transfer of Care (Signed)
Immediate Anesthesia Transfer of Care Note  Patient: Charlene Larson  Procedure(s) Performed: COLONOSCOPY WITH PROPOFOL POLYPECTOMY INTESTINAL BIOPSY  Patient Location: PACU  Anesthesia Type:General  Level of Consciousness: awake  Airway & Oxygen Therapy: Patient Spontanous Breathing  Post-op Assessment: Report given to RN and Post -op Vital signs reviewed and stable  Post vital signs: Reviewed and stable  Last Vitals:  Vitals Value Taken Time  BP 90/58 10/13/22 0829  Temp 36.4 C 10/13/22 0829  Pulse 79 10/13/22 0829  Resp 15 10/13/22 0829  SpO2 100 % 10/13/22 0829    Last Pain:  Vitals:   10/13/22 0829  TempSrc: Oral  PainSc: 0-No pain      Patients Stated Pain Goal: 5 (55/21/74 7159)  Complications: No notable events documented.

## 2022-10-13 NOTE — Op Note (Addendum)
[facility] Patient Name: Charlene Larson Procedure Date: 10/13/2022 7:58 AM MRN: [id number] Date of Birth: 22-May-1976 Attending MD: Maylon Peppers , , [id number] CSN: [id number] Age: 46 Admit Type: Outpatient Procedure:                Colonoscopy Indications:              Abdominal pain in the right lower quadrant, Chronic                            diarrhea, Rectal bleeding Providers:                Maylon Peppers, Crystal Page, Raphael Gibney,                            Technician Referring MD:              Medicines:                Monitored Anesthesia Care Complications:            No immediate complications. Estimated Blood Loss:     Estimated blood loss: none. Procedure:                Pre-Anesthesia Assessment:                           - Prior to the procedure, a History and Physical                            was performed, and patient medications, allergies                            and sensitivities were reviewed. The patient's                            tolerance of previous anesthesia was reviewed.                           - The risks and benefits of the procedure and the                            sedation options and risks were discussed with the                            patient. All questions were answered and informed                            consent was obtained.                           - ASA Grade Assessment: II - A patient with mild                            systemic disease.                           After obtaining informed consent, the colonoscope  was passed under direct vision. Throughout the                            procedure, the patient's blood pressure, pulse, and                            oxygen saturations were monitored continuously. The                            [id number]-HQ190L ([id number]) scope was introduced through                            the anus and advanced to the the terminal ileum.                             The colonoscopy was performed without difficulty.                            The patient tolerated the procedure well. The                            quality of the bowel preparation was excellent. Scope In: 8:06:57 AM Scope Out: 8:26:37 AM Scope Withdrawal Time: 0 hours 16 minutes 39 seconds  Total Procedure Duration: 0 hours 19 minutes 40 seconds  Findings:      The perianal and digital rectal examinations were normal.      The terminal ileum appeared normal.      Three sessile polyps were found in the ascending colon and cecum. The       polyps were 4 to 6 mm in size. These polyps were removed with a cold       snare. Resection and retrieval were complete.      The rest of the colon appeared normal. Biopsies for histology were taken       with a cold forceps from the right colon and left colon for evaluation       of microscopic colitis.      Non-bleeding internal hemorrhoids were found during retroflexion. The       hemorrhoids were small. Impression:               - The examined portion of the ileum was normal.                           - Three 4 to 6 mm polyps in the ascending colon and                            in the cecum, removed with a cold snare. Resected                            and retrieved.                           - The rest of the examined colon is normal.  Biopsied.                           - Non-bleeding internal hemorrhoids. Moderate Sedation:      Per Anesthesia Care Recommendation:           - Discharge patient to home (ambulatory).                           - Try to implement a low FODMAP diet.                           - Await pathology results.                           - Repeat colonoscopy for surveillance based on                            pathology results.                           - Switch to Levsin as needed for abdominal pain Procedure Code(s):        --- Professional ---                           [phone],  Colonoscopy, flexible; with removal of                            tumor(s), polyp(s), or other lesion(s) by snare                            technique                           [id number], 59, Colonoscopy, flexible; with biopsy,                            single or multiple Diagnosis Code(s):        --- Professional ---                           K64.8, Other hemorrhoids                           D12.2, Benign neoplasm of ascending colon                           D12.0, Benign neoplasm of cecum                           R10.31, Right lower quadrant pain                           K52.9, Noninfective gastroenteritis and colitis,                            unspecified  K62.5, Hemorrhage of anus and rectum CPT copyright 2022 American Medical Association. All rights reserved. The codes documented in this report are preliminary and upon coder review may  be revised to meet current compliance requirements. Maylon Peppers, MD Maylon Peppers,  10/13/2022 8:36:52 AM This report has been signed electronically. Number of Addenda: 0

## 2022-10-13 NOTE — Anesthesia Postprocedure Evaluation (Signed)
Anesthesia Post Note  Patient: Charlene Larson  Procedure(s) Performed: COLONOSCOPY WITH PROPOFOL POLYPECTOMY INTESTINAL BIOPSY  Patient location during evaluation: Phase II Anesthesia Type: General Level of consciousness: awake Pain management: pain level controlled Vital Signs Assessment: post-procedure vital signs reviewed and stable Respiratory status: spontaneous breathing and respiratory function stable Cardiovascular status: blood pressure returned to baseline and stable Postop Assessment: no headache and no apparent nausea or vomiting Anesthetic complications: no Comments: Late entry   No notable events documented.   Last Vitals:  Vitals:   10/13/22 0829 10/13/22 0833  BP: (!) 90/58 104/89  Pulse: 79   Resp: 15   Temp: (!) 36.4 C   SpO2: 100%     Last Pain:  Vitals:   10/13/22 0829  TempSrc: Oral  PainSc: 0-No pain                 Louann Sjogren

## 2022-10-13 NOTE — Discharge Instructions (Signed)
You are being discharged to home.  Try to implement a low FODMAP diet. We are waiting for your pathology results.  Your physician has recommended a repeat colonoscopy for surveillance based on pathology results.  Continue dicyclomine as needed for abdominal pain, can take up to 3 times per day.

## 2022-10-13 NOTE — H&P (Signed)
Charlene Larson is an 46 y.o. female.   Chief Complaint: abdominal pain, blood in stool, nausea HPI: 46 year old female with past medical history of IBS, hypothyroidism, coming for evaluation of abdominal pain, blood in stool and nausea.  Patient reports she has had chronic episodes of right sided abdominal pain which is intermittent but can be very severe.  Has been taking Bentyl for it.  Sometimes has bouts of severe diarrhea that can lead to some episode of rectal bleeding when it is very profuse.  Past Medical History:  Diagnosis Date   Abdominal pain    intermittent; IBS-D   FHx: allergies    pt does have allergies   IBS (irritable bowel syndrome)    Diarrhea type   Non-celiac gluten sensitivity    Reflux    Spontaneous pneumothorax    x2   Thyroid disease     Past Surgical History:  Procedure Laterality Date   D&C with miscarriage     HERNIA REPAIR     HYSTEROSCOPY W/ ENDOMETRIAL ABLATION     repair of broken jaw     right hand usrgery     SALPINGECTOMY     TONSILLECTOMY AND ADENOIDECTOMY      Family History  Problem Relation Age of Onset   Obesity Mother    Asthma Mother    Colon cancer Maternal Great-grandmother    Inflammatory bowel disease Neg Hx    Celiac disease Neg Hx    Social History:  reports that she has never smoked. She has never used smokeless tobacco. She reports that she does not drink alcohol and does not use drugs.  Allergies:  Allergies  Allergen Reactions   Rice     Throat swelling (certain type of rice)   Sporanox [Itraconazole] Rash    Medications Prior to Admission  Medication Sig Dispense Refill   acetaminophen (TYLENOL) 500 MG tablet Take 1,000 mg by mouth every 8 (eight) hours as needed for moderate pain.     cholecalciferol (VITAMIN D3) 25 MCG (1000 UNIT) tablet Take 1,000 Units by mouth daily.     famotidine (ZANTAC 360) 10 MG tablet Take 10 mg by mouth daily as needed for heartburn or indigestion.     fexofenadine (ALLEGRA)  180 MG tablet Take 180 mg by mouth daily.       fluticasone (FLONASE) 50 MCG/ACT nasal spray Place 2 sprays into both nostrils daily as needed for allergies.     ibuprofen (ADVIL) 200 MG tablet Take 400 mg by mouth every 8 (eight) hours as needed for moderate pain.     levothyroxine (SYNTHROID) 75 MCG tablet Take 75 mcg by mouth See admin instructions. Take 75 mcg every day except on Fridays     Lysine 500 MG CAPS Take 500 mg by mouth daily.     Melatonin 2.5 MG CHEW Chew 2.5 mg by mouth at bedtime.     Multiple Vitamins-Minerals (MULTIVITAL) tablet Take 1 tablet by mouth daily.       ondansetron (ZOFRAN) 4 MG tablet Take 1 tablet (4 mg total) by mouth every 6 (six) hours as needed for nausea or vomiting. 30 tablet 1   OVER THE COUNTER MEDICATION Take 1 tablet by mouth daily. Trace Minerals     Peppermint Oil (IBGARD PO) Take 1 capsule by mouth daily.     Probiotic Product (PROBIOTIC DAILY PO) Take 1 capsule by mouth daily. 50 billion     valACYclovir (VALTREX) 1000 MG tablet Take 1,000 mg by mouth daily  as needed (For fever blister).     Vitamin D, Ergocalciferol, (DRISDOL) 1.25 MG (50000 UNIT) CAPS capsule Take 50,000 Units by mouth every Friday.     dicyclomine (BENTYL) 10 MG capsule Take 10 mg by mouth 3 (three) times daily as needed for spasms.      Results for orders placed or performed during the hospital encounter of 10/11/22 (from the past 48 hour(s))  Pregnancy, urine     Status: None   Collection Time: 10/11/22 11:04 AM  Result Value Ref Range   Preg Test, Ur NEGATIVE NEGATIVE    Comment:        THE SENSITIVITY OF THIS METHODOLOGY IS >20 mIU/mL. Performed at [facility], [address], [place], [place] [address]    No results found.  Review of Systems  Constitutional: Negative.   HENT: Negative.    Eyes: Negative.   Respiratory: Negative.    Cardiovascular: Negative.   Gastrointestinal:  Positive for abdominal pain, blood in stool and nausea.  Endocrine: Negative.    Genitourinary: Negative.   Musculoskeletal: Negative.   Skin: Negative.   Allergic/Immunologic: Negative.   Neurological: Negative.   Hematological: Negative.   Psychiatric/Behavioral: Negative.      Blood pressure 127/79, pulse 85, temperature 98 F (36.7 C), temperature source Oral, resp. rate 18, height '5\' 4"'$  (1.626 m), weight 63.5 kg, SpO2 100 %. Physical Exam  GENERAL: The patient is AO x3, in no acute distress. HEENT: Head is normocephalic and atraumatic. EOMI are intact. Mouth is well hydrated and without lesions. NECK: Supple. No masses LUNGS: Clear to auscultation. No presence of rhonchi/wheezing/rales. Adequate chest expansion HEART: RRR, normal s1 and s2. ABDOMEN:tender in right side of abdomen, no guarding, no peritoneal signs, and nondistended. BS +. No masses. EXTREMITIES: Without any cyanosis, clubbing, rash, lesions or edema. NEUROLOGIC: AOx3, no focal motor deficit. SKIN: no jaundice, no rashes  Assessment/Plan  46 year old female with past medical history of IBS, hypothyroidism, coming for evaluation of abdominal pain, blood in stool and nausea.  Will proceed with colonoscopy.  Harvel Quale, MD 10/13/2022, 7:27 AM

## 2022-10-13 NOTE — Anesthesia Preprocedure Evaluation (Signed)
Anesthesia Evaluation  Patient identified by MRN, date of birth, ID band Patient awake    Reviewed: Allergy & Precautions, H&P , NPO status , Patient's Chart, lab work & pertinent test results, reviewed documented beta blocker date and time   Airway Mallampati: II  TM Distance: >3 FB Neck ROM: full    Dental no notable dental hx.    Pulmonary neg pulmonary ROS   Pulmonary exam normal breath sounds clear to auscultation       Cardiovascular Exercise Tolerance: Good negative cardio ROS  Rhythm:regular Rate:Normal     Neuro/Psych negative neurological ROS  negative psych ROS   GI/Hepatic negative GI ROS, Neg liver ROS,,,  Endo/Other  negative endocrine ROS    Renal/GU negative Renal ROS  negative genitourinary   Musculoskeletal   Abdominal   Peds  Hematology negative hematology ROS (+)   Anesthesia Other Findings   Reproductive/Obstetrics negative OB ROS                             Anesthesia Physical Anesthesia Plan  ASA: 2  Anesthesia Plan: General   Post-op Pain Management:    Induction:   PONV Risk Score and Plan: Propofol infusion  Airway Management Planned:   Additional Equipment:   Intra-op Plan:   Post-operative Plan:   Informed Consent: I have reviewed the patients History and Physical, chart, labs and discussed the procedure including the risks, benefits and alternatives for the proposed anesthesia with the patient or authorized representative who has indicated his/her understanding and acceptance.     Dental Advisory Given  Plan Discussed with: CRNA  Anesthesia Plan Comments:        Anesthesia Quick Evaluation  

## 2022-10-17 LAB — SURGICAL PATHOLOGY

## 2022-10-18 ENCOUNTER — Encounter (INDEPENDENT_AMBULATORY_CARE_PROVIDER_SITE_OTHER): Payer: Self-pay | Admitting: *Deleted

## 2022-10-25 ENCOUNTER — Encounter (HOSPITAL_COMMUNITY): Payer: Self-pay | Admitting: Gastroenterology

## 2022-11-15 ENCOUNTER — Other Ambulatory Visit: Payer: Self-pay | Admitting: Gastroenterology

## 2022-11-23 DIAGNOSIS — R5383 Other fatigue: Secondary | ICD-10-CM | POA: Diagnosis not present

## 2022-12-08 ENCOUNTER — Other Ambulatory Visit: Payer: BC Managed Care – PPO

## 2022-12-15 ENCOUNTER — Ambulatory Visit
Admission: RE | Admit: 2022-12-15 | Discharge: 2022-12-15 | Disposition: A | Discharge status: Home / Self Care | Payer: BC Managed Care – PPO | Source: Ambulatory Visit | Attending: Obstetrics and Gynecology | Admitting: Obstetrics and Gynecology

## 2022-12-15 DIAGNOSIS — R921 Mammographic calcification found on diagnostic imaging of breast: Secondary | ICD-10-CM

## 2022-12-15 DIAGNOSIS — R922 Inconclusive mammogram: Secondary | ICD-10-CM | POA: Diagnosis not present

## 2022-12-15 DIAGNOSIS — N6323 Unspecified lump in the left breast, lower outer quadrant: Secondary | ICD-10-CM | POA: Diagnosis not present

## 2022-12-15 DIAGNOSIS — N6312 Unspecified lump in the right breast, upper inner quadrant: Secondary | ICD-10-CM | POA: Diagnosis not present

## 2023-05-10 ENCOUNTER — Other Ambulatory Visit: Payer: Self-pay | Admitting: Gastroenterology

## 2023-05-22 DIAGNOSIS — L820 Inflamed seborrheic keratosis: Secondary | ICD-10-CM | POA: Diagnosis not present

## 2023-05-22 DIAGNOSIS — D225 Melanocytic nevi of trunk: Secondary | ICD-10-CM | POA: Diagnosis not present

## 2023-05-22 DIAGNOSIS — Z1283 Encounter for screening for malignant neoplasm of skin: Secondary | ICD-10-CM | POA: Diagnosis not present

## 2023-05-23 ENCOUNTER — Other Ambulatory Visit (INDEPENDENT_AMBULATORY_CARE_PROVIDER_SITE_OTHER): Payer: Self-pay | Admitting: Gastroenterology

## 2023-05-23 NOTE — Telephone Encounter (Signed)
Tcs on 10/13/22

## 2023-06-14 DIAGNOSIS — R1031 Right lower quadrant pain: Secondary | ICD-10-CM | POA: Diagnosis not present

## 2023-06-21 DIAGNOSIS — R1031 Right lower quadrant pain: Secondary | ICD-10-CM | POA: Diagnosis not present

## 2023-06-21 DIAGNOSIS — N8300 Follicular cyst of ovary, unspecified side: Secondary | ICD-10-CM | POA: Diagnosis not present

## 2023-08-29 ENCOUNTER — Other Ambulatory Visit: Payer: Self-pay

## 2023-08-29 ENCOUNTER — Emergency Department (HOSPITAL_COMMUNITY)
Admission: EM | Admit: 2023-08-29 | Discharge: 2023-08-29 | Disposition: A | Discharge status: Home / Self Care | Payer: BC Managed Care – PPO | Attending: Emergency Medicine | Admitting: Emergency Medicine

## 2023-08-29 ENCOUNTER — Emergency Department (HOSPITAL_COMMUNITY): Payer: BC Managed Care – PPO

## 2023-08-29 ENCOUNTER — Encounter (HOSPITAL_COMMUNITY): Payer: Self-pay

## 2023-08-29 DIAGNOSIS — R9431 Abnormal electrocardiogram [ECG] [EKG]: Secondary | ICD-10-CM | POA: Diagnosis not present

## 2023-08-29 DIAGNOSIS — N83201 Unspecified ovarian cyst, right side: Secondary | ICD-10-CM | POA: Insufficient documentation

## 2023-08-29 DIAGNOSIS — R0602 Shortness of breath: Secondary | ICD-10-CM | POA: Insufficient documentation

## 2023-08-29 DIAGNOSIS — R9389 Abnormal findings on diagnostic imaging of other specified body structures: Secondary | ICD-10-CM | POA: Insufficient documentation

## 2023-08-29 DIAGNOSIS — E039 Hypothyroidism, unspecified: Secondary | ICD-10-CM | POA: Diagnosis not present

## 2023-08-29 DIAGNOSIS — R102 Pelvic and perineal pain: Secondary | ICD-10-CM

## 2023-08-29 DIAGNOSIS — R0789 Other chest pain: Secondary | ICD-10-CM | POA: Insufficient documentation

## 2023-08-29 DIAGNOSIS — R109 Unspecified abdominal pain: Secondary | ICD-10-CM | POA: Diagnosis not present

## 2023-08-29 DIAGNOSIS — R1031 Right lower quadrant pain: Secondary | ICD-10-CM | POA: Diagnosis not present

## 2023-08-29 LAB — COMPREHENSIVE METABOLIC PANEL
ALT: 21 U/L (ref 0–44)
AST: 19 U/L (ref 15–41)
Albumin: 4.8 g/dL (ref 3.5–5.0)
Alkaline Phosphatase: 40 U/L (ref 38–126)
Anion gap: 9 (ref 5–15)
BUN: 12 mg/dL (ref 6–20)
CO2: 26 mmol/L (ref 22–32)
Calcium: 9.3 mg/dL (ref 8.9–10.3)
Chloride: 104 mmol/L (ref 98–111)
Creatinine, Ser: 0.71 mg/dL (ref 0.44–1.00)
GFR, Estimated: >60 mL/min (ref >60)
Glucose, Bld: 100 mg/dL — ABNORMAL HIGH (ref 70–99)
Potassium: 3.7 mmol/L (ref 3.5–5.1)
Sodium: 139 mmol/L (ref 135–145)
Total Bilirubin: 0.6 mg/dL (ref <1.2)
Total Protein: 8 g/dL (ref 6.5–8.1)

## 2023-08-29 LAB — CBC WITH DIFFERENTIAL/PLATELET
Abs Immature Granulocytes: 0 10*3/uL (ref 0.00–0.07)
Basophils Absolute: 0 10*3/uL (ref 0.0–0.1)
Basophils Relative: 1 %
Eosinophils Absolute: 0.1 10*3/uL (ref 0.0–0.5)
Eosinophils Relative: 2 %
HCT: 41.6 % (ref 36.0–46.0)
Hemoglobin: 14 g/dL (ref 12.0–15.0)
Immature Granulocytes: 0 %
Lymphocytes Relative: 28 %
Lymphs Abs: 1.7 10*3/uL (ref 0.7–4.0)
MCH: 31.3 pg (ref 26.0–34.0)
MCHC: 33.7 g/dL (ref 30.0–36.0)
MCV: 92.9 fL (ref 80.0–100.0)
Monocytes Absolute: 0.3 10*3/uL (ref 0.1–1.0)
Monocytes Relative: 5 %
Neutro Abs: 3.9 10*3/uL (ref 1.7–7.7)
Neutrophils Relative %: 64 %
Platelets: 280 10*3/uL (ref 150–400)
RBC: 4.48 MIL/uL (ref 3.87–5.11)
RDW: 12.6 % (ref 11.5–15.5)
WBC: 6 10*3/uL (ref 4.0–10.5)
nRBC: 0 % (ref 0.0–0.2)

## 2023-08-29 LAB — URINALYSIS, ROUTINE W REFLEX MICROSCOPIC
Bilirubin Urine: NEGATIVE
Glucose, UA: NEGATIVE mg/dL
Hgb urine dipstick: NEGATIVE
Ketones, ur: NEGATIVE mg/dL
Leukocytes,Ua: NEGATIVE
Nitrite: NEGATIVE
Protein, ur: NEGATIVE mg/dL
Specific Gravity, Urine: 1.003 — ABNORMAL LOW (ref 1.005–1.030)
pH: 6 (ref 5.0–8.0)

## 2023-08-29 LAB — PREGNANCY, URINE: Preg Test, Ur: NEGATIVE

## 2023-08-29 MED ORDER — MELOXICAM 15 MG PO TBDP: 15.0000 mg | ORAL_TABLET | Freq: Every day | ORAL | 0 refills | Status: AC

## 2023-08-29 MED ORDER — KETOROLAC TROMETHAMINE 30 MG/ML IJ SOLN
30.0000 mg | Freq: Once | INTRAMUSCULAR | Status: AC
  Administered 2023-08-29: 30 mg via INTRAVENOUS
  Filled 2023-08-29: qty 1

## 2023-08-29 NOTE — ED Provider Notes (Signed)
[place] [facility]ERGENCY DEPARTMENT AT [facility] Provider Note   CSN: [id number] Arrival date & time: 08/29/23  5284     History  Chief Complaint  Patient presents with   Abdominal Pain    Charlene Larson is a 47 y.o. female.   Abdominal Pain Associated symptoms: no dysuria, no fever, no nausea and no vomiting    This patient is a 47 year old female, she has a history of hypothyroidism, she has some degree of chronic abdominal pain which she has suffered with, has been seen by multiple physicians including her primary doctors and OB/GYN and has ultimately been diagnosed with a cyst on her right ovary and a small mass in her uterus which was diagnosed in August of this year.  Unfortunately the patient has continued to suffer with intermittent abdominal pain which usually comes on about once every couple of weeks and last for a day or so and then goes away.  This episode of pain started on Friday approximately 5 or 6 days ago and has been fluctuating in intensity but present most days.  It has caused a small amount of associated shortness of breath and chest discomfort which was transient and went away and is not present at this time.  It is not making her nauseated or vomiting, there is no diarrhea constipation or dysuria, no fevers or chills.  It is in the same location as her pain has been with prior episodes, it just lasted longer.  She has been taking Tylenol and ibuprofen and states that it gives some transient relief but it is not giving her significant relief.  She declines any opiate therapy,    Home Medications Prior to Admission medications   Medication Sig Start Date End Date Taking? Authorizing Provider  acetaminophen (TYLENOL) 500 MG tablet Take 1,000 mg by mouth every 8 (eight) hours as needed for moderate pain.   Yes [provider]  cholecalciferol (VITAMIN D3) 25 MCG (1000 UNIT) tablet Take 2,000 Units by mouth daily.   Yes [provider]   dicyclomine (BENTYL) 10 MG capsule Take 10 mg by mouth every 6 (six) hours as needed. 08/20/23  Yes [provider]  fexofenadine (ALLEGRA) 180 MG tablet Take 180 mg by mouth daily.     Yes [provider]  fluticasone (FLONASE) 50 MCG/ACT nasal spray Place 2 sprays into both nostrils daily as needed for allergies.   Yes [provider]  hyoscyamine (LEVSIN SL) 0.125 MG SL tablet DISSOLVE ONE TABLET UNDER THE TONGUE EVERY 6 HOURS AS NEEDED Patient taking differently: Take 0.125 mg by mouth every 6 (six) hours as needed for cramping. 05/23/23  Yes Marguerita Merles, Reuel Boom, MD  ibuprofen (ADVIL) 200 MG tablet Take 400 mg by mouth every 8 (eight) hours as needed for moderate pain.   Yes [provider]  levothyroxine (SYNTHROID) 75 MCG tablet Take 75 mcg by mouth See admin instructions. Take 75 mcg every day except on Fridays   Yes [provider]  Lysine 500 MG CAPS Take 500 mg by mouth daily.   Yes [provider]  Melatonin 2.5 MG CHEW Chew 2.5 mg by mouth at bedtime.   Yes [provider]  Meloxicam 15 MG TBDP Take 15 mg by mouth daily for 14 days. 08/29/23 09/12/23 Yes Eber Hong, MD  Multiple Vitamins-Minerals (MULTIVITAL) tablet Take 1 tablet by mouth daily.     Yes [provider]  ondansetron (ZOFRAN) 4 MG tablet TAKE 1 TABLET BY MOUTH  EVERY 6 HOURS AS NEEDED FOR NAUSEA OR FOR VOMITING Patient taking differently: Take 4 mg by mouth every 6 (six) hours as needed for vomiting or nausea. 05/11/23  Yes Tiffany Kocher, PA-C  OVER THE COUNTER MEDICATION Take 1 tablet by mouth daily. Trace Minerals   Yes [provider]  Peppermint Oil (IBGARD PO) Take 1 capsule by mouth daily.   Yes [provider]  Probiotic Product (PROBIOTIC DAILY PO) Take 1 capsule by mouth daily. 50 billion   Yes [provider]  valACYclovir (VALTREX) 1000 MG tablet Take 1,000 mg by mouth daily as needed (For fever blister).  02/23/21  Yes [provider]      Allergies    Rice and Sporanox [itraconazole]    Review of Systems   Review of Systems  Constitutional:  Negative for fever.  Gastrointestinal:  Positive for abdominal pain. Negative for nausea and vomiting.  Genitourinary:  Negative for dysuria.    Physical Exam Updated Vital Signs BP 118/80   Pulse 73   Temp 98.6 F (37 C)   Resp 17   SpO2 98%  Physical Exam Vitals and nursing note reviewed.  Constitutional:      General: She is not in acute distress.    Appearance: She is well-developed.  HENT:     Head: Normocephalic and atraumatic.     Mouth/Throat:     Pharynx: No oropharyngeal exudate.  Eyes:     General: No scleral icterus.       Right eye: No discharge.        Left eye: No discharge.     Conjunctiva/sclera: Conjunctivae normal.     Pupils: Pupils are equal, round, and reactive to light.  Neck:     Thyroid: No thyromegaly.     Vascular: No JVD.  Cardiovascular:     Rate and Rhythm: Normal rate and regular rhythm.     Heart sounds: Normal heart sounds. No murmur heard.    No friction rub. No gallop.  Pulmonary:     Effort: Pulmonary effort is normal. No respiratory distress.     Breath sounds: Normal breath sounds. No wheezing or rales.  Abdominal:     General: Bowel sounds are normal. There is no distension.     Palpations: Abdomen is soft. There is no mass.     Tenderness: There is abdominal tenderness in the right lower quadrant. There is no guarding or rebound. Negative signs include Rovsing's sign, psoas sign and obturator sign.     Hernia: No hernia is present.  Musculoskeletal:        General: No tenderness. Normal range of motion.     Cervical back: Normal range of motion and neck supple.  Lymphadenopathy:     Cervical: No cervical adenopathy.  Skin:    General: Skin is warm and dry.     Findings: No erythema or rash.  Neurological:     Mental Status: She is alert.     Coordination: Coordination  normal.  Psychiatric:        Behavior: Behavior normal.     ED Results / Procedures / Treatments   Labs (all labs ordered are listed, but only abnormal results are displayed) Labs Reviewed  COMPREHENSIVE METABOLIC PANEL - Abnormal; Notable for the following components:      Result Value   Glucose, Bld 100 (*)    All other components within normal limits  URINALYSIS, ROUTINE W REFLEX MICROSCOPIC - Abnormal; Notable for the following components:  Color, Urine COLORLESS (*)    Specific Gravity, Urine 1.003 (*)    All other components within normal limits  CBC WITH DIFFERENTIAL/PLATELET  PREGNANCY, URINE    EKG EKG Interpretation Date/Time:  Wednesday August 29 2023 07:43:32 EST Ventricular Rate:  72 PR Interval:  132 QRS Duration:  95 QT Interval:  393 QTC Calculation: 431 R Axis:   77  Text Interpretation: Sinus rhythm Normal ECG Confirmed by Eber Hong ([id number]) on 08/29/2023 7:45:46 AM  Radiology US PELVIC COMPLETE W TRANSVAGINAL AND TORSION R/O  Result Date: 08/29/2023 CLINICAL DATA:  Acute right-sided pelvic pain. EXAM: TRANSABDOMINAL AND TRANSVAGINAL ULTRASOUND OF PELVIS DOPPLER ULTRASOUND OF OVARIES TECHNIQUE: Both transabdominal and transvaginal ultrasound examinations of the pelvis were performed. Transabdominal technique was performed for global imaging of the pelvis including uterus, ovaries, adnexal regions, and pelvic cul-de-sac. It was necessary to proceed with endovaginal exam following the transabdominal exam to visualize the endometrium and ovaries. Color and duplex Doppler ultrasound was utilized to evaluate blood flow to the ovaries. COMPARISON:  June 21, 2023. FINDINGS: Uterus Measurements: 10.2 x 6.1 x 5.0 cm = volume: 162 mL. Heterogeneity is seen involving the uterine fundus. Is uncertain if this is due to uterine mass or potentially artifact. Endometrium Endometrium in lower uterine segment is visualized and somewhat heterogeneous. Endometrium toward the  fundus is not well visualized due to either mass or artifact as described above. Right ovary Measurements: 3.8 x 1.9 x 1.8 cm = volume: 7 mL. Stable hypoechoic but heterogeneous abnormality measuring 1.8 x 1.7 x 1.3 cm is noted which may represent complex cyst or possibly mass. Left ovary Measurements: 5.5 x 3.4 x 2.2 cm = volume: 22 3.5 cm cyst is noted. ML. Normal appearance/no adnexal mass. Pulsed Doppler evaluation of both ovaries demonstrates normal low-resistance arterial and venous waveforms. Other findings Trace free fluid is noted which most likely is physiologic. IMPRESSION: No evidence of ovarian torsion. Heterogeneity is seen involving the uterine fundus, and it is uncertain if this is due to possible uterine mass or potentially artifact. As result, the endometrium is not well visualized in this area. MRI is recommended for further evaluation. Stable hypoechoic but heterogeneous abnormality measuring 1.8 cm is noted in right ovary which may represent complex cyst or mass. Attention to this abnormality on MRI is recommended as well. Electronically Signed   By: Lupita Raider M.D.   On: 08/29/2023 10:41    Procedures Procedures    Medications Ordered in ED Medications  ketorolac (TORADOL) 30 MG/ML injection 30 mg (30 mg Intravenous Given 08/29/23 0736)    ED Course/ Medical Decision Making/ A&P                                 Medical Decision Making Amount and/or Complexity of Data Reviewed Labs: ordered. Radiology: ordered. ECG/medicine tests: ordered.  Risk Prescription drug management.    This patient presents to the ED for concern of ABdominal pain / RLQ pain, this involves an extensive number of treatment options, and is a complaint that carries with it a high risk of complications and morbidity.  The differential diagnosis includes cyst / torsion / hemorrahgic cyst, appy seems less likely - has no murphy's sign.   Co morbidities that complicate the patient  evaluation  Prior salpingectomy bilaterally   Additional history obtained:  Additional history obtained from medical record External records from outside source obtained and reviewed including prior ovarian  ultrasound or pelvic ultrasound, confirming right complex cyst and small 7 mm intrauterine mass   Lab Tests:  I Ordered, and personally interpreted labs.  The pertinent results include: No leukocytosis, no metabolic abnormalities, urinalysis clear   Imaging Studies ordered:  I ordered imaging studies including [place] torsion / pelvic I independently visualized and interpreted imaging which showed heterogeneous complex cyst of the right ovary, something abnormal in the fundus of the uterus, poorly characterized I agree with the radiologist interpretation   Cardiac Monitoring: / EKG:  The patient was maintained on a cardiac monitor.  I personally viewed and interpreted the cardiac monitored which showed an underlying rhythm of: NSR EKG - without acute findings to suggest ischemia.  -The patient has no exertional symptoms to suggest that this is a primary cardiac event   Consultations Obtained:  I requested consultation with the Dr. Wyatt Mage [facility],  and discussed lab and imaging findings as well as pertinent plan - they recommend: Follow-up in the outpatient setting   Problem List / ED Course / Critical interventions / Medication management  Patient given follow-up instructions, results, she is agreeable to the plan, expressed her understanding, she is frustrated that she cannot be seen sooner but at this time there is nothing else that appears to be imminently surgical or emergent, she is agreeable to a prescription anti-inflammatory I ordered medication including ketorolac intravenous for pelvic pain Reevaluation of the patient after these medicines showed that the patient improved I have reviewed the patients home medicines and have made adjustments  as needed   Social Determinants of Health:  None   Test / Admission - Considered:  Considered admission but nothing surgical found on workup, vital signs have been normal, nonsurgical abdomen         Final Clinical Impression(s) / ED Diagnoses Final diagnoses:  Pelvic pain  Cyst of right ovary  Abnormal ultrasound    Rx / DC Orders ED Discharge Orders          Ordered    Meloxicam 15 MG TBDP  Daily        08/29/23 1239              Eber Hong, MD 08/29/23 1301

## 2023-08-29 NOTE — Discharge Instructions (Addendum)
Your testing today did show a couple of abnormal findings including an abnormal ultrasound of the uterus and the ovary.  These structures appear to be consistent with having a cyst but this is not a normal cyst and that needs further workup by the gynecologist.  Please [facility], call today to make the next fillable appointment.  I have prescribed a medication called meloxicam which you can take once a day to help with pain.  Do not combine this with ibuprofen but you can combine this with Tylenol.  Thank you for allowing [place] to treat you in the emergency department today.  After reviewing your examination and potential testing that was done it appears that you are safe to go home.  I would like for you to follow-up with your doctor within the next several days, have them obtain your records and follow-up with them to review all potential tests and results from your visit.  If you should develop severe or worsening symptoms return to the emergency department immediately  Did speak with the office, the physician on-call said that you can call the office for a follow-up and they will have the office call you, if you do not hear from them within 48 hours I would call them directly.  See the phone number above.

## 2023-08-29 NOTE — ED Notes (Signed)
Pt returned from US

## 2023-08-29 NOTE — ED Triage Notes (Signed)
Pt c/o lower right abdominal pain since Friday that is intermittent. Seen for this before. Endorses nausea. No vomiting or urinary symptoms.

## 2023-08-30 ENCOUNTER — Encounter: Payer: Self-pay | Admitting: Obstetrics & Gynecology

## 2023-08-30 ENCOUNTER — Ambulatory Visit (INDEPENDENT_AMBULATORY_CARE_PROVIDER_SITE_OTHER): Payer: BC Managed Care – PPO | Admitting: Obstetrics & Gynecology

## 2023-08-30 ENCOUNTER — Other Ambulatory Visit: Payer: Self-pay | Admitting: Obstetrics and Gynecology

## 2023-08-30 VITALS — BP 117/77 | HR 77 | Ht 64.0 in | Wt 145.4 lb

## 2023-08-30 DIAGNOSIS — R102 Pelvic and perineal pain: Secondary | ICD-10-CM

## 2023-08-30 DIAGNOSIS — Z1231 Encounter for screening mammogram for malignant neoplasm of breast: Secondary | ICD-10-CM

## 2023-08-30 DIAGNOSIS — Z9889 Other specified postprocedural states: Secondary | ICD-10-CM | POA: Diagnosis not present

## 2023-08-30 DIAGNOSIS — N83202 Unspecified ovarian cyst, left side: Secondary | ICD-10-CM

## 2023-08-30 DIAGNOSIS — N83201 Unspecified ovarian cyst, right side: Secondary | ICD-10-CM | POA: Diagnosis not present

## 2023-08-30 MED ORDER — NORETHINDRONE 0.35 MG PO TABS: 1.0000 | ORAL_TABLET | Freq: Every day | ORAL | 4 refills | Status: DC

## 2023-08-30 NOTE — Addendum Note (Signed)
Addended by: Sharon Seller on: 08/30/2023 02:36 PM   Modules accepted: Orders

## 2023-08-30 NOTE — Progress Notes (Signed)
GYN VISIT Patient name: Charlene Larson MRN [id number]  Date of birth: 1975-12-03 Chief Complaint:   Pelvic Pain  History of Present Illness:   Charlene Larson is a 47 y.o. [phone] female being seen today for ER follow up.  Pelvic pain: Last bad episode was in August, this past time the pain was not going away.  Breakthrough pain despite medication- pain 10/10, wakes her up out of sleep.  Pain RLQ- cramping to sharp pain and pain will radiate to her upper abdomen, chest and jaw.   Typically several times per month- typically will last 1-2 days and resolve while this has been going on for more than 6 days.  No nausea, no vomiting.  Pain is not every month.    Also notes change in her BMs- states the color is lighter than previous and has noted some diarrhea.    H/o endometrial ablation with BTL- typically one day of light spotting.  In August had some breakthrough bleeding during the pain, but that has not happened since that time.  Colonoscopy 09/2022   Pt seems very frustrated with this issue as it is significantly impact her daily life.  Patient's last menstrual period was 08/14/2023.    Review of Systems:   Pertinent items are noted in HPI Denies fever/chills, dizziness, headaches, visual disturbances, fatigue, shortness of breath, chest pain.  Denies urinary concerns. Denies pain with IC. Pertinent History Reviewed:   Past Surgical History:  Procedure Laterality Date   BIOPSY  10/13/2022   Procedure: BIOPSY;  Surgeon: Dolores Frame, MD;  Location: [facility];  Service: Gastroenterology;;   COLONOSCOPY WITH PROPOFOL N/A 10/13/2022   Procedure: COLONOSCOPY WITH PROPOFOL;  Surgeon: Dolores Frame, MD;  Location: [facility];  Service: Gastroenterology;  Laterality: N/A;  8:15 am   D&C with miscarriage     HERNIA REPAIR     HYSTEROSCOPY W/ ENDOMETRIAL ABLATION     POLYPECTOMY  10/13/2022   Procedure: POLYPECTOMY INTESTINAL;  Surgeon: Marguerita Merles, Reuel Boom, MD;  Location: [facility];  Service: Gastroenterology;;   repair of broken jaw     right hand usrgery     SALPINGECTOMY     TONSILLECTOMY AND ADENOIDECTOMY      Past Medical History:  Diagnosis Date   Abdominal pain    intermittent; IBS-D   FHx: allergies    pt does have allergies   IBS (irritable bowel syndrome)    Diarrhea type   Non-celiac gluten sensitivity    Reflux    Spontaneous pneumothorax    x2   Thyroid disease    Reviewed problem list, medications and allergies. Physical Assessment:   Vitals:   08/30/23 0908  BP: 117/77  Pulse: 77  Weight: 145 lb 6.4 oz (66 kg)  Height: 5\' 4"  (1.626 m)  Body mass index is 24.96 kg/m.       Physical Examination:   General appearance: alert, well appearing, and in no distress  Psych: mood appropriate, normal affect  Skin: warm & dry   Cardiovascular: normal heart rate noted  Respiratory: normal respiratory effort, no distress  Abdomen: soft, mild tenderness with palpation midline, no rebound, no guarding  Pelvic: VULVA: normal appearing vulva with no masses, tenderness or lesions, VAGINA: normal appearing vagina with normal color and discharge, no lesions, CERVIX: normal appearing cervix without discharge or lesions, UTERUS: uterus is normal size, shape, consistency and nontender, ADNEXA: normal adnexa in size, nontender and no masses  Extremities: no edema  Chaperone: Faith Rogue    [place] 08/29/23: 10.2 x 6.1 x 5.0 cm = volume: 162 mL. Heterogeneity is seen involving the uterine fundus. Is uncertain if this is due to uterine mass or potentially artifact.  Stable hypoechoic but heterogeneous abnormality measuring 1.8 cm is noted in right ovary which may represent complex cyst or mass.  Prior US 05/2023:   Assessment & Plan:  1) Pelvic pain -etiology of pain unclear -reviewed both [place] in August and recent one yesterday.  The largest cyst noted on the left and seems simple in nature.  Based on US findings  and history do not think her pain is gynecologic in nature -concern that pain may be GI -recommendation for follow up [facility] for further evaluation -also discussed started POPs to see if she does in fact note any improvement -IF full evaluation including GI work up is still negative and pending whether or not she sees a change with POPs may consider discussing surgical intervention -briefly discussed surgical intervention (hysterectomy) and discussed that the concern would be that pain was not gynecologic in nature and thereby did not have improvement of her symptoms.  As mention, await further evaluation specifically GI before assuming pain is gyn in nature   Meds ordered this encounter  Medications   norethindrone (MICRONOR) 0.35 MG tablet    Sig: Take 1 tablet (0.35 mg total) by mouth daily.    Dispense:  90 tablet    Refill:  4      Return for please scheduled for [facility] next available.   Myna Hidalgo, DO Attending Obstetrician & Gynecologist, [facility] for [employer], [facility]

## 2023-08-31 ENCOUNTER — Telehealth: Payer: Self-pay | Admitting: *Deleted

## 2023-08-31 ENCOUNTER — Ambulatory Visit
Admission: RE | Admit: 2023-08-31 | Discharge: 2023-08-31 | Disposition: A | Discharge status: Home / Self Care | Payer: BC Managed Care – PPO | Source: Ambulatory Visit | Attending: Gastroenterology | Admitting: Gastroenterology

## 2023-08-31 ENCOUNTER — Encounter: Payer: Self-pay | Admitting: Gastroenterology

## 2023-08-31 ENCOUNTER — Ambulatory Visit (INDEPENDENT_AMBULATORY_CARE_PROVIDER_SITE_OTHER): Payer: BC Managed Care – PPO | Admitting: Gastroenterology

## 2023-08-31 VITALS — BP 115/78 | HR 89 | Temp 98.5°F | Ht 64.0 in | Wt 145.6 lb

## 2023-08-31 DIAGNOSIS — R1031 Right lower quadrant pain: Secondary | ICD-10-CM | POA: Diagnosis not present

## 2023-08-31 DIAGNOSIS — R935 Abnormal findings on diagnostic imaging of other abdominal regions, including retroperitoneum: Secondary | ICD-10-CM | POA: Diagnosis not present

## 2023-08-31 DIAGNOSIS — G8929 Other chronic pain: Secondary | ICD-10-CM | POA: Insufficient documentation

## 2023-08-31 MED ORDER — IOPAMIDOL (ISOVUE-300) INJECTION 61%
100.0000 mL | Freq: Once | INTRAVENOUS | Status: AC | PRN
  Administered 2023-08-31: 100 mL via INTRAVENOUS

## 2023-08-31 NOTE — Patient Instructions (Signed)
CT scan today to evaluate your abdominal pain. I will be in touch with results as soon as available.

## 2023-08-31 NOTE — Progress Notes (Signed)
GI Office Note    Referring Provider: [facility] Primary Care Physician:  [facility]  Primary Gastroenterologist: [name], MD   Chief Complaint   Chief Complaint  Patient presents with   Follow-up    Pt following up after ED visit, with lower abd/pelvic pain. Pt still in pain    History of Present Illness   Charlene Larson is a 47 y.o. female presenting today for follow-up of recent ED.  She was last in the office in November 2023.  She has a history of IBS-D.  She reports negative celiac screen but does have gluten sensitivity therefore avoids gluten.  Seen in the ED November 6 with complaints of abdominal pain.  Continues to have intermittent abdominal pain, usually occurring once every couple of weeks and last for about a day at a time.  Last episode lasting longer than usual.  Has been seen by GYN with diagnosis of right ovarian cyst and a small mass in the uterus  While in the ED she had a transabdominal and transvaginal ultrasound with Dopplers no evidence of ovarian torsion.  Heterogeneity seen involving the uterine fundus, unclear if this is possible uterine mass or artifact.  As a result endometrium not well-visualized.  MRI recommended for further evaluation.  Stable hypoechoic but heterogeneous abnormality measuring 1.8 cm noted in the right ovary which may represent complex cyst or mass, attention to this abnormality on MRI is recommended as well.  Ultrasound pelvis transabdominal/transvaginal June 21, 2023 with possible endometrial mass measuring 7 mm, heterogeneous mass arises from right ovary, likely complicated cyst/corpus luteum recommending ultrasound in 6 to 12 weeks.  Abdominal ultrasound August 2024: With unremarkable gallbladder, liver.  Patient seen by GYN, Dr. Charlotta Newton yesterday.  Etiology of pain unclear, she is concerned that patient's pain may not be from a gynecological source.  Recommended sonohysterogram for  further evaluation.  Discussed starting POPs (progesterone only oral contraceptives) to see if any improvement in pain.  GI workup negative and if no improvement on POPs then  consider hysterectomy.  Patient states this pain is much different then her chronic intermittent RLQ/IBS. She had her first bad episode 05/2023 resulting in evaluation as outlined above. This last episode began one week ago. Woke her up from sleep on Friday night. Pain in the RLQ. Felt like hot lava inside there. By Sunday, the pain became more severe and radiated into RUQ and into her right chest and jaw. She has been unable to sleep. Seems to tolerate pain somewhat better during the day. Tried Levsin with minimal relief. By Wednesday  pain severe enough she went to ED. She is very tired from feeling bad the past week. Very discouraged. Tearful in exam room. Denies any fever. Has noted stools are formed to loose, light yellow without melena, brbpr. Constant cramping from RLQ to right hip/back. No dysuria. No fever. No vomiting. Has had nausea with the pain. No recent antibiotics.  She also notes remote ablation, usually one day of spotting each month. In 05/2023 when she had severe pain she also had breakthrough bleeding at that time.   Colonoscopy December 2023: -Ileum normal -three 4 to 6 mm polyps in the ascending colon and in the cecum, tubular adenomas -Colon normal status post biopsy, random colon biopsies negative -Nonbleeding internal hemorrhoids -Colonoscopy in 5 years  Medications   Current Outpatient Medications  Medication Sig Dispense Refill   acetaminophen (TYLENOL) 500 MG tablet Take 1,000 mg by mouth every  8 (eight) hours as needed for moderate pain.     cholecalciferol (VITAMIN D3) 25 MCG (1000 UNIT) tablet Take 2,000 Units by mouth daily.     dicyclomine (BENTYL) 10 MG capsule Take 10 mg by mouth every 6 (six) hours as needed.     fexofenadine (ALLEGRA) 180 MG tablet Take 180 mg by mouth daily.        fluticasone (FLONASE) 50 MCG/ACT nasal spray Place 2 sprays into both nostrils daily as needed for allergies.     ibuprofen (ADVIL) 200 MG tablet Take 400 mg by mouth every 8 (eight) hours as needed for moderate pain.     levothyroxine (SYNTHROID) 75 MCG tablet Take 75 mcg by mouth See admin instructions. Take 75 mcg every day except on Fridays     Lysine 500 MG CAPS Take 500 mg by mouth daily.     Melatonin 2.5 MG CHEW Chew 2.5 mg by mouth at bedtime.     Multiple Vitamins-Minerals (MULTIVITAL) tablet Take 1 tablet by mouth daily.       ondansetron (ZOFRAN) 4 MG tablet TAKE 1 TABLET BY MOUTH EVERY 6 HOURS AS NEEDED FOR NAUSEA OR FOR VOMITING (Patient taking differently: Take 4 mg by mouth every 6 (six) hours as needed for vomiting or nausea.) 30 tablet 1   OVER THE COUNTER MEDICATION Take 1 tablet by mouth daily. Trace Minerals     Peppermint Oil (IBGARD PO) Take 1 capsule by mouth daily.     Probiotic Product (PROBIOTIC DAILY PO) Take 1 capsule by mouth daily. 50 billion     valACYclovir (VALTREX) 1000 MG tablet Take 1,000 mg by mouth daily as needed (For fever blister).     hyoscyamine (LEVSIN SL) 0.125 MG SL tablet DISSOLVE ONE TABLET UNDER THE TONGUE EVERY 6 HOURS AS NEEDED (Patient not taking: Reported on 08/31/2023) 90 tablet 2   Meloxicam 15 MG TBDP Take 15 mg by mouth daily for 14 days. (Patient not taking: Reported on 08/31/2023) 14 tablet 0   norethindrone (MICRONOR) 0.35 MG tablet Take 1 tablet (0.35 mg total) by mouth daily. (Patient not taking: Reported on 08/31/2023) 90 tablet 4   No current facility-administered medications for this visit.    Allergies   Allergies as of 08/31/2023 - Review Complete 08/31/2023  Allergen Reaction Noted   Rice  11/26/2018   Sporanox [itraconazole] Rash 10/30/2008        Review of Systems   General: Negative for anorexia, weight loss, fever, chills, fatigue, weakness. ENT: Negative for hoarseness, difficulty swallowing , nasal congestion. CV:  Negative for chest pain, angina, palpitations, dyspnea on exertion, peripheral edema.  Respiratory: Negative for dyspnea at rest, dyspnea on exertion, cough, sputum, wheezing.  GI: See history of present illness. GU:  Negative for dysuria, hematuria, urinary incontinence, urinary frequency, nocturnal urination.  Endo: Negative for unusual weight change.     Physical Exam   BP 115/78   Pulse 89   Temp 98.5 F (36.9 C)   Ht 5\' 4"  (1.626 m)   Wt 145 lb 9.6 oz (66 kg)   LMP 08/14/2023   BMI 24.99 kg/m    General: Well-nourished, well-developed in no acute distress.  Eyes: No icterus. Mouth: Oropharyngeal mucosa moist and pink   Lungs: Clear to auscultation bilaterally.  Heart: Regular rate and rhythm, no murmurs rubs or gallops.  Abdomen: Bowel sounds are normal,   nondistended, no hepatosplenomegaly or masses,  no abdominal bruits or hernia , no rebound. Moderate pain in right lower  quadrant with milder pain in ruq. Some guarding. No cva tenderness  Rectal: not performed  Extremities: No lower extremity edema. No clubbing or deformities. Neuro: Alert and oriented x 4   Skin: Warm and dry, no jaundice.   Psych: Alert and cooperative, normal mood and affect.  Labs   Lab Results  Component Value Date   NA 139 08/29/2023   CL 104 08/29/2023   K 3.7 08/29/2023   CO2 26 08/29/2023   BUN 12 08/29/2023   CREATININE 0.71 08/29/2023   GFRNONAA >60 08/29/2023   CALCIUM 9.3 08/29/2023   ALBUMIN 4.8 08/29/2023   GLUCOSE 100 (H) 08/29/2023   Lab Results  Component Value Date   ALT 21 08/29/2023   AST 19 08/29/2023   ALKPHOS 40 08/29/2023   BILITOT 0.6 08/29/2023   Lab Results  Component Value Date   WBC 6.0 08/29/2023   HGB 14.0 08/29/2023   HCT 41.6 08/29/2023   MCV 92.9 08/29/2023   PLT 280 08/29/2023    Imaging Studies   US PELVIC COMPLETE W TRANSVAGINAL AND TORSION R/O  Result Date: 08/29/2023 CLINICAL DATA:  Acute right-sided pelvic pain. EXAM: TRANSABDOMINAL AND  TRANSVAGINAL ULTRASOUND OF PELVIS DOPPLER ULTRASOUND OF OVARIES TECHNIQUE: Both transabdominal and transvaginal ultrasound examinations of the pelvis were performed. Transabdominal technique was performed for global imaging of the pelvis including uterus, ovaries, adnexal regions, and pelvic cul-de-sac. It was necessary to proceed with endovaginal exam following the transabdominal exam to visualize the endometrium and ovaries. Color and duplex Doppler ultrasound was utilized to evaluate blood flow to the ovaries. COMPARISON:  June 21, 2023. FINDINGS: Uterus Measurements: 10.2 x 6.1 x 5.0 cm = volume: 162 mL. Heterogeneity is seen involving the uterine fundus. Is uncertain if this is due to uterine mass or potentially artifact. Endometrium Endometrium in lower uterine segment is visualized and somewhat heterogeneous. Endometrium toward the fundus is not well visualized due to either mass or artifact as described above. Right ovary Measurements: 3.8 x 1.9 x 1.8 cm = volume: 7 mL. Stable hypoechoic but heterogeneous abnormality measuring 1.8 x 1.7 x 1.3 cm is noted which may represent complex cyst or possibly mass. Left ovary Measurements: 5.5 x 3.4 x 2.2 cm = volume: 22 3.5 cm cyst is noted. ML. Normal appearance/no adnexal mass. Pulsed Doppler evaluation of both ovaries demonstrates normal low-resistance arterial and venous waveforms. Other findings Trace free fluid is noted which most likely is physiologic. IMPRESSION: No evidence of ovarian torsion. Heterogeneity is seen involving the uterine fundus, and it is uncertain if this is due to possible uterine mass or potentially artifact. As result, the endometrium is not well visualized in this area. MRI is recommended for further evaluation. Stable hypoechoic but heterogeneous abnormality measuring 1.8 cm is noted in right ovary which may represent complex cyst or mass. Attention to this abnormality on MRI is recommended as well. Electronically Signed   By: Lupita Raider M.D.   On: 08/29/2023 10:41    Assessment/Plan:   RLQ pain: etiology unclear. At baseline has h/o chronic RLQ pain with her IBS-D presentations but typically much milder symptoms lasting less than a day. Current episode more severe, lasting for 7 days so far. In 05/2023, noted to have abnormal u/s of endometrium and right ovary, repeat u/s in ED with similar findings recommending MRI evaluation, she is being followed by gyn.  -given severity of symptoms, exam today, we need to consider possibility of acute appendicitis. Recommend CT A/P with contrast today. Patient  agreeable.     Leanna Battles. Melvyn Neth, MHS, PA-C [facility]  I have reviewed the note and agree with the APP's assessment as described in this progress note  [name], MD Gastroenterology and Hepatology [facility]

## 2023-08-31 NOTE — Telephone Encounter (Signed)
Carelon PA: Order ID: [id number]       Authorized  Approval Valid Through: 08/31/2023 - 09/29/2023  Pt instructed to go to [facility] as a walk in and CT will be done today.

## 2023-09-06 MED ORDER — HYOSCYAMINE SULFATE 0.125 MG SL SUBL: 0.1250 mg | SUBLINGUAL_TABLET | SUBLINGUAL | 2 refills | Status: DC | PRN

## 2023-09-06 MED ORDER — DICYCLOMINE HCL 10 MG PO CAPS: 10.0000 mg | ORAL_CAPSULE | Freq: Four times a day (QID) | ORAL | 2 refills | Status: DC | PRN

## 2023-09-07 NOTE — Telephone Encounter (Signed)
Tammy, can you schedule patient to see Dr. Levon Hedger. See mychart message.

## 2023-09-07 NOTE — Telephone Encounter (Signed)
Please arrange an appt with Dr. Levon Hedger. Thanks!

## 2023-09-10 NOTE — Telephone Encounter (Signed)
Forwarded to Mitzie to sch'd OV 

## 2023-09-11 ENCOUNTER — Other Ambulatory Visit: Payer: BC Managed Care – PPO

## 2023-09-11 ENCOUNTER — Ambulatory Visit: Payer: BC Managed Care – PPO | Admitting: Obstetrics & Gynecology

## 2023-09-13 ENCOUNTER — Ambulatory Visit (INDEPENDENT_AMBULATORY_CARE_PROVIDER_SITE_OTHER): Payer: BC Managed Care – PPO | Admitting: Gastroenterology

## 2023-09-13 ENCOUNTER — Encounter (INDEPENDENT_AMBULATORY_CARE_PROVIDER_SITE_OTHER): Payer: Self-pay | Admitting: Gastroenterology

## 2023-09-13 VITALS — BP 116/68 | HR 83 | Temp 97.4°F | Ht 64.0 in | Wt 145.9 lb

## 2023-09-13 DIAGNOSIS — G8929 Other chronic pain: Secondary | ICD-10-CM

## 2023-09-13 DIAGNOSIS — R102 Pelvic and perineal pain: Secondary | ICD-10-CM

## 2023-09-13 DIAGNOSIS — R1031 Right lower quadrant pain: Secondary | ICD-10-CM

## 2023-09-13 NOTE — Progress Notes (Signed)
[name], M.D. Gastroenterology & Hepatology [facility] Gastroenterology [address] [place], [place] [address]  Primary Care Physician: Lawerance Sabal, [place] [address] [place] [address]  I will communicate my assessment and recommendations to the referring MD via EMR.  Problems: Right lower quadrant abdominal and pelvic pain  History of Present Illness: Charlene Larson is a 47 y.o. female with past medical history of IBS-D of IBS-D, IBS-D, GERD , who presents for follow up of right lower quadrant abdominal pain.  The patient was last seen on 08/31/23. At that time, the patient was ordered a CT of the abdomen pelvis with IV contrast..  Patient underwent an urgent imaging study on 08/31/2023 that was within normal limits with only showed small amount of free pelvic fluid.  Patient reports that she has had fluctuation in the frequency of her abdominal pain episodes. She is having these episodes several time a month. States Tylenol helps sometimes. She takes Levsin only if it is really severe as it would cause severe somnolence. Pain is located in the RLQ.   Patient states her last episode was 2 weeks ago which lasted a couple of weeks ago. Due to this she went to the ER 08/29/2023 prior to her last visit in our clinic.  At that time she underwent a pelvic ultrasound that showed presence of heterogeneous uterine fundus (concern for mass versus artifact) and presence of a stable heterogeneous 1.8 cm abnormality in the right ovary.  Patient states that her abdominal episodes started after her endometrial ablation in the past.  She did not have any pain prior to this.  The patient denies having any nausea, vomiting, fever, chills, hematochezia, melena, hematemesis, abdominal distention, diarrhea, jaundice, pruritus or weight loss.  Last EGD: at [facility] in 2009, patietn believes it was normal but no reports are available  Last Colonoscopy:  10/13/2022 - The examined portion of the ileum was normal. - Three 4 to 6 mm polyps in the ascending colon and in the cecum, removed with a cold snare. Resected and retrieved. - The rest of the examined colon is normal. Biopsied. - Non- bleeding internal hemorrhoids.  Pathology showed three colonic tubular adenoma and normal random colonic biopsies, repeat colonoscopy in 5 years   Past Medical History: Past Medical History:  Diagnosis Date   Abdominal pain    intermittent; IBS-D   FHx: allergies    pt does have allergies   IBS (irritable bowel syndrome)    Diarrhea type   Non-celiac gluten sensitivity    Reflux    Spontaneous pneumothorax    x2   Thyroid disease     Past Surgical History: Past Surgical History:  Procedure Laterality Date   BIOPSY  10/13/2022   Procedure: BIOPSY;  Surgeon: Dolores Frame, MD;  [facility];  Service: Gastroenterology;;   COLONOSCOPY WITH PROPOFOL N/A 10/13/2022   Procedure: COLONOSCOPY WITH PROPOFOL;  Surgeon: Dolores Frame, MD;  [facility];  Service: Gastroenterology;  Laterality: N/A;  8:15 am   D&C with miscarriage     HERNIA REPAIR     HYSTEROSCOPY W/ ENDOMETRIAL ABLATION     POLYPECTOMY  10/13/2022   Procedure: POLYPECTOMY INTESTINAL;  Surgeon: Dolores Frame, MD;  [facility];  Service: Gastroenterology;;   repair of broken jaw     right hand usrgery     SALPINGECTOMY     TONSILLECTOMY AND ADENOIDECTOMY  Family History: Family History  Problem Relation Age of Onset   Obesity Mother    Asthma Mother    Colon cancer Maternal Great-grandmother    Inflammatory bowel disease Neg Hx    Celiac disease Neg Hx     Social History: Social History   Tobacco Use  Smoking Status Never  Smokeless Tobacco Never   Social History   Substance and Sexual Activity  Alcohol Use No   Social History   Substance and Sexual Activity  Drug Use Never     Allergies: Allergies  Allergen Reactions   Rice     Throat swelling (certain type of rice)   Sporanox [Itraconazole] Rash    Medications: Current Outpatient Medications  Medication Sig Dispense Refill   acetaminophen (TYLENOL) 500 MG tablet Take 1,000 mg by mouth every 8 (eight) hours as needed for moderate pain.     cholecalciferol (VITAMIN D3) 25 MCG (1000 UNIT) tablet Take 2,000 Units by mouth daily.     dicyclomine (BENTYL) 10 MG capsule Take 1 capsule (10 mg total) by mouth every 6 (six) hours as needed (do not take with hyoscyamine). 90 capsule 2   fexofenadine (ALLEGRA) 180 MG tablet Take 180 mg by mouth daily.       fluticasone (FLONASE) 50 MCG/ACT nasal spray Place 2 sprays into both nostrils daily as needed for allergies.     hyoscyamine (LEVSIN SL) 0.125 MG SL tablet Take 1 tablet (0.125 mg total) by mouth every 4 (four) hours as needed (do not take with dicylomine). 90 tablet 2   ibuprofen (ADVIL) 200 MG tablet Take 400 mg by mouth every 8 (eight) hours as needed for moderate pain.     levothyroxine (SYNTHROID) 75 MCG tablet Take 75 mcg by mouth See admin instructions. Take 75 mcg every day except on Fridays     Lysine 500 MG CAPS Take 500 mg by mouth daily.     Melatonin 2.5 MG CHEW Chew 2.5 mg by mouth at bedtime.     Multiple Vitamins-Minerals (MULTIVITAL) tablet Take 1 tablet by mouth daily.       ondansetron (ZOFRAN) 4 MG tablet TAKE 1 TABLET BY MOUTH EVERY 6 HOURS AS NEEDED FOR NAUSEA OR FOR VOMITING (Patient taking differently: Take 4 mg by mouth every 6 (six) hours as needed for vomiting or nausea.) 30 tablet 1   OVER THE COUNTER MEDICATION Take 1 tablet by mouth daily. Trace Minerals     Peppermint Oil (IBGARD PO) Take 1 capsule by mouth daily.     Probiotic Product (PROBIOTIC DAILY PO) Take 1 capsule by mouth daily. 50 billion     valACYclovir (VALTREX) 1000 MG tablet Take 1,000 mg by mouth daily as needed (For fever blister).     norethindrone (MICRONOR) 0.35  MG tablet Take 1 tablet (0.35 mg total) by mouth daily. (Patient not taking: Reported on 08/31/2023) 90 tablet 4   No current facility-administered medications for this visit.    Review of Systems: GENERAL: negative for malaise, night sweats HEENT: No changes in hearing or vision, no nose bleeds or other nasal problems. NECK: Negative for lumps, goiter, pain and significant neck swelling RESPIRATORY: Negative for cough, wheezing CARDIOVASCULAR: Negative for chest pain, leg swelling, palpitations, orthopnea GI: SEE HPI MUSCULOSKELETAL: Negative for joint pain or swelling, back pain, and muscle pain. SKIN: Negative for lesions, rash PSYCH: Negative for sleep disturbance, mood disorder and recent psychosocial stressors. HEMATOLOGY Negative for prolonged bleeding, bruising easily, and swollen nodes. ENDOCRINE: Negative for cold  or heat intolerance, polyuria, polydipsia and goiter. NEURO: negative for tremor, gait imbalance, syncope and seizures. The remainder of the review of systems is noncontributory.   Physical Exam: BP 116/68 (BP Location: Left Arm, Patient Position: Sitting, Cuff Size: Normal)   Pulse 83   Temp (!) 97.4 F (36.3 C) (Oral)   Ht 5\' 4"  (1.626 m)   Wt 145 lb 14.4 oz (66.2 kg)   LMP 08/14/2023   BMI 25.04 kg/m  GENERAL: The patient is AO x3, in no acute distress. HEENT: Head is normocephalic and atraumatic. EOMI are intact. Mouth is well hydrated and without lesions. NECK: Supple. No masses LUNGS: Clear to auscultation. No presence of rhonchi/wheezing/rales. Adequate chest expansion HEART: RRR, normal s1 and s2. ABDOMEN:  tender to palpation in the RLQ and R inguinal area, no guarding, no peritoneal signs, and nondistended. BS +. No masses. EXTREMITIES: Without any cyanosis, clubbing, rash, lesions or edema. NEUROLOGIC: AOx3, no focal motor deficit. SKIN: no jaundice, no rashes  Imaging/Labs: as above  I personally reviewed and interpreted the available labs,  imaging and endoscopic files.  Impression and Plan: DARSHA LUIZ is a 47 y.o. female with past medical history of IBS-D of IBS-D, IBS-D, GERD , who presents for follow up of right lower quadrant abdominal pain.  The patient has presented for acute episodes of bleeding..  Of unclear etiology.  She reports that this started after she underwent endometrial ablation.  Has had multiple imaging and endoscopic evaluations that have been negative for an etiology of her pain.  Given the location of her symptoms and the presence of free fluid in her pelvis, I suspect that her symptoms are likely GYN in nature or related to her previous interventions.  We discussed the possibility of trying low-dose TCA to alleviate her abdominal complaints but she would like to hold off on this for now.  She will let us know if interested trial of low-dose TCA.  -Follow up with GYN regarding abdominopelvic pain -Can consider starting Elavil 10 mg at bedtime for abdominal pain, patient will read about this medication and will let us know if interested started  All questions were answered.      [name], MD Gastroenterology and Hepatology [facility] Gastroenterology

## 2023-09-13 NOTE — Patient Instructions (Addendum)
Follow up with GYN regarding abdominopelvic pain, can ask about endometriosis or cyst as cause of pain If interested, please reach his back so we can prescribe Elavil 10 mg at bedtime for abdominal pain

## 2023-09-15 NOTE — Progress Notes (Incomplete)
[name], M.D. Gastroenterology & Hepatology [facility] Gastroenterology [address] [place], [place] [address]  Primary Care Physician: Lawerance Sabal, [place] [address] [place] [address]  I will communicate my assessment and recommendations to the referring MD via EMR.  Problems: Right lower quadrant abdominal and pelvic pain  History of Present Illness: Charlene Larson is a 47 y.o. female with past medical history of IBS-D of IBS-D, IBS-D, GERD , who presents for follow up of right lower quadrant abdominal pain  The patient was last seen on 08/31/23. At that time, the patient was ordered a CT of the abdomen pelvis with IV contrast..  Patient underwent an urgent imaging study on 08/31/2023 that was within normal limits with only showed small amount of free pelvic fluid.  Patient reports that she has had fluctuation in the frequency of her abdominal pain episodes. She is having these episodes several time a month. States Tylenol helps sometimes. She takes Levsin only if it is really severe as it would cause severe somnolence. Pain is located in the RLQ.   Patient states her last episode was 2 weeks ago which lasted a couple of weeks ago. Due to this she went to the ER 08/29/2023 prior to her last visit in our clinic.  At that time she underwent a pelvic ultrasound that showed presence of heterogeneous uterine fundus (concern for mass versus artifact) and presence of a stable heterogeneous 1.8 cm abnormality in the right ovary.  Patient states that her abdominal episodes started after her endometrial ablation in the past.  The patient denies having any nausea, vomiting, fever, chills, hematochezia, melena, hematemesis, abdominal distention, abdominal pain, diarrhea, jaundice, pruritus or weight loss.  Last EGD: at [facility] in 2009, patietn believes it was normal but no reports are available  Last Colonoscopy: 10/13/2022 - The examined  portion of the ileum was normal. - Three 4 to 6 mm polyps in the ascending colon and in the cecum, removed with a cold snare. Resected and retrieved. - The rest of the examined colon is normal. Biopsied. - Non- bleeding internal hemorrhoids.  Pathology showed three colonic tubular adenoma and normal random colonic biopsies, repeat colonoscopy in 5 years   Past Medical History: Past Medical History:  Diagnosis Date  . Abdominal pain    intermittent; IBS-D  . FHx: allergies    pt does have allergies  . IBS (irritable bowel syndrome)    Diarrhea type  . Non-celiac gluten sensitivity   . Reflux   . Spontaneous pneumothorax    x2  . Thyroid disease     Past Surgical History: Past Surgical History:  Procedure Laterality Date  . BIOPSY  10/13/2022   Procedure: BIOPSY;  Surgeon: Dolores Frame, MD;  [facility];  Service: Gastroenterology;;  . COLONOSCOPY WITH PROPOFOL N/A 10/13/2022   Procedure: COLONOSCOPY WITH PROPOFOL;  Surgeon: Dolores Frame, MD;  [facility];  Service: Gastroenterology;  Laterality: N/A;  8:15 am  . D&C with miscarriage    . HERNIA REPAIR    . HYSTEROSCOPY W/ ENDOMETRIAL ABLATION    . POLYPECTOMY  10/13/2022   Procedure: POLYPECTOMY INTESTINAL;  Surgeon: Dolores Frame, MD;  [facility];  Service: Gastroenterology;;  . repair of broken jaw    . right hand usrgery    . SALPINGECTOMY    . TONSILLECTOMY AND ADENOIDECTOMY      Family History: Family History  Problem  Relation Age of Onset  . Obesity Mother   . Asthma Mother   . Colon cancer Maternal Great-grandmother   . Inflammatory bowel disease Neg Hx   . Celiac disease Neg Hx     Social History: Social History   Tobacco Use  Smoking Status Never  Smokeless Tobacco Never   Social History   Substance and Sexual Activity  Alcohol Use No   Social History   Substance and Sexual Activity  Drug Use Never     Allergies: Allergies  Allergen Reactions  . Rice     Throat swelling (certain type of rice)  . Sporanox [Itraconazole] Rash    Medications: Current Outpatient Medications  Medication Sig Dispense Refill  . acetaminophen (TYLENOL) 500 MG tablet Take 1,000 mg by mouth every 8 (eight) hours as needed for moderate pain.    . cholecalciferol (VITAMIN D3) 25 MCG (1000 UNIT) tablet Take 2,000 Units by mouth daily.    Marland Kitchen dicyclomine (BENTYL) 10 MG capsule Take 1 capsule (10 mg total) by mouth every 6 (six) hours as needed (do not take with hyoscyamine). 90 capsule 2  . fexofenadine (ALLEGRA) 180 MG tablet Take 180 mg by mouth daily.      . fluticasone (FLONASE) 50 MCG/ACT nasal spray Place 2 sprays into both nostrils daily as needed for allergies.    . hyoscyamine (LEVSIN SL) 0.125 MG SL tablet Take 1 tablet (0.125 mg total) by mouth every 4 (four) hours as needed (do not take with dicylomine). 90 tablet 2  . ibuprofen (ADVIL) 200 MG tablet Take 400 mg by mouth every 8 (eight) hours as needed for moderate pain.    Marland Kitchen levothyroxine (SYNTHROID) 75 MCG tablet Take 75 mcg by mouth See admin instructions. Take 75 mcg every day except on Fridays    . Lysine 500 MG CAPS Take 500 mg by mouth daily.    . Melatonin 2.5 MG CHEW Chew 2.5 mg by mouth at bedtime.    . Multiple Vitamins-Minerals (MULTIVITAL) tablet Take 1 tablet by mouth daily.      . ondansetron (ZOFRAN) 4 MG tablet TAKE 1 TABLET BY MOUTH EVERY 6 HOURS AS NEEDED FOR NAUSEA OR FOR VOMITING (Patient taking differently: Take 4 mg by mouth every 6 (six) hours as needed for vomiting or nausea.) 30 tablet 1  . OVER THE COUNTER MEDICATION Take 1 tablet by mouth daily. Trace Minerals    . Peppermint Oil (IBGARD PO) Take 1 capsule by mouth daily.    . Probiotic Product (PROBIOTIC DAILY PO) Take 1 capsule by mouth daily. 50 billion    . valACYclovir (VALTREX) 1000 MG tablet Take 1,000 mg by mouth daily as needed (For fever blister).    .  norethindrone (MICRONOR) 0.35 MG tablet Take 1 tablet (0.35 mg total) by mouth daily. (Patient not taking: Reported on 08/31/2023) 90 tablet 4   No current facility-administered medications for this visit.    Review of Systems: GENERAL: negative for malaise, night sweats HEENT: No changes in hearing or vision, no nose bleeds or other nasal problems. NECK: Negative for lumps, goiter, pain and significant neck swelling RESPIRATORY: Negative for cough, wheezing CARDIOVASCULAR: Negative for chest pain, leg swelling, palpitations, orthopnea GI: SEE HPI MUSCULOSKELETAL: Negative for joint pain or swelling, back pain, and muscle pain. SKIN: Negative for lesions, rash PSYCH: Negative for sleep disturbance, mood disorder and recent psychosocial stressors. HEMATOLOGY Negative for prolonged bleeding, bruising easily, and swollen nodes. ENDOCRINE: Negative for cold or heat intolerance, polyuria, polydipsia and  goiter. NEURO: negative for tremor, gait imbalance, syncope and seizures. The remainder of the review of systems is noncontributory.   Physical Exam: BP 116/68 (BP Location: Left Arm, Patient Position: Sitting, Cuff Size: Normal)   Pulse 83   Temp (!) 97.4 F (36.3 C) (Oral)   Ht 5\' 4"  (1.626 m)   Wt 145 lb 14.4 oz (66.2 kg)   LMP 08/14/2023   BMI 25.04 kg/m  GENERAL: The patient is AO x3, in no acute distress. HEENT: Head is normocephalic and atraumatic. EOMI are intact. Mouth is well hydrated and without lesions. NECK: Supple. No masses LUNGS: Clear to auscultation. No presence of rhonchi/wheezing/rales. Adequate chest expansion HEART: RRR, normal s1 and s2. ABDOMEN:  tender to palpation in the RLQ and R inguinal area, no guarding, no peritoneal signs, and nondistended. BS +. No masses. RECTAL EXAM: no external lesions, normal tone, no masses, brown stool without blood.*** Chaperone: EXTREMITIES: Without any cyanosis, clubbing, rash, lesions or edema. NEUROLOGIC: AOx3, no focal  motor deficit. SKIN: no jaundice, no rashes  Imaging/Labs: as above  I personally reviewed and interpreted the available labs, imaging and endoscopic files.  Impression and Plan: MONIYAH CANNEY is a 47 y.o. female coming for follow up of ***   All questions were answered.      [name], MD Gastroenterology and Hepatology [facility] Gastroenterology

## 2023-09-24 DIAGNOSIS — E039 Hypothyroidism, unspecified: Secondary | ICD-10-CM | POA: Diagnosis not present

## 2023-09-24 DIAGNOSIS — Z8639 Personal history of other endocrine, nutritional and metabolic disease: Secondary | ICD-10-CM | POA: Diagnosis not present

## 2023-09-24 DIAGNOSIS — Z01411 Encounter for gynecological examination (general) (routine) with abnormal findings: Secondary | ICD-10-CM | POA: Diagnosis not present

## 2023-09-24 DIAGNOSIS — Z0000 Encounter for general adult medical examination without abnormal findings: Secondary | ICD-10-CM | POA: Diagnosis not present

## 2023-09-24 DIAGNOSIS — B001 Herpesviral vesicular dermatitis: Secondary | ICD-10-CM | POA: Diagnosis not present

## 2023-09-24 DIAGNOSIS — Z01419 Encounter for gynecological examination (general) (routine) without abnormal findings: Secondary | ICD-10-CM | POA: Diagnosis not present

## 2023-09-24 LAB — TSH: TSH: 0.47 (ref 0.41–5.90)

## 2023-10-12 DIAGNOSIS — N951 Menopausal and female climacteric states: Secondary | ICD-10-CM | POA: Diagnosis not present

## 2023-10-12 DIAGNOSIS — N939 Abnormal uterine and vaginal bleeding, unspecified: Secondary | ICD-10-CM | POA: Diagnosis not present

## 2023-10-12 DIAGNOSIS — R102 Pelvic and perineal pain: Secondary | ICD-10-CM | POA: Diagnosis not present

## 2023-10-12 DIAGNOSIS — R1031 Right lower quadrant pain: Secondary | ICD-10-CM | POA: Diagnosis not present

## 2023-10-22 NOTE — Patient Instructions (Signed)

## 2023-10-23 ENCOUNTER — Ambulatory Visit (INDEPENDENT_AMBULATORY_CARE_PROVIDER_SITE_OTHER): Payer: BC Managed Care – PPO | Admitting: Nurse Practitioner

## 2023-10-23 ENCOUNTER — Encounter: Payer: Self-pay | Admitting: Nurse Practitioner

## 2023-10-23 VITALS — BP 119/78 | HR 94 | Ht 64.0 in | Wt 144.8 lb

## 2023-10-23 DIAGNOSIS — E039 Hypothyroidism, unspecified: Secondary | ICD-10-CM | POA: Diagnosis not present

## 2023-10-23 NOTE — Progress Notes (Signed)
 Endocrinology Consult Note                                         10/23/2023, 2:33 PM  Subjective:   Subjective    Charlene  MALVA Larson is a 47 y.o.-year-old female patient being seen in consultation for hypothyroidism referred by Job Bolt, [place].   Past Medical History:  Diagnosis Date   Abdominal pain    intermittent; IBS-D   FHx: allergies    pt does have allergies   IBS (irritable bowel syndrome)    Diarrhea type   Non-celiac gluten sensitivity    Reflux    Spontaneous pneumothorax    x2   Thyroid  disease     Past Surgical History:  Procedure Laterality Date   BIOPSY  10/13/2022   Procedure: BIOPSY;  Surgeon: Eartha Angelia Sieving, MD;  [facility];  Service: Gastroenterology;;   COLONOSCOPY WITH PROPOFOL  N/A 10/13/2022   Procedure: COLONOSCOPY WITH PROPOFOL ;  Surgeon: Eartha Angelia Sieving, MD;  [facility];  Service: Gastroenterology;  Laterality: N/A;  8:15 am   D&C with miscarriage     HERNIA REPAIR     HYSTEROSCOPY W/ ENDOMETRIAL ABLATION     POLYPECTOMY  10/13/2022   Procedure: POLYPECTOMY INTESTINAL;  Surgeon: Eartha Angelia Sieving, MD;  [facility];  Service: Gastroenterology;;   repair of broken jaw     right hand usrgery     SALPINGECTOMY     TONSILLECTOMY AND ADENOIDECTOMY      Social History   Socioeconomic History   Marital status: Married    Spouse name: Not on file   Number of children: Not on file   Years of education: Not on file   Highest education level: Not on file  Occupational History   Not on file  Tobacco Use   Smoking status: Never   Smokeless tobacco: Never  Vaping Use   Vaping status: Never Used  Substance and Sexual Activity   Alcohol use: No   Drug use: Never   Sexual activity: Yes    Birth control/protection: Surgical  Other Topics Concern   Not on file  Social History Narrative   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    Social Drivers of Corporate Investment Banker Strain: Low Risk  (08/30/2023)   Overall Financial Resource Strain (CARDIA)    Difficulty of Paying Living Expenses: Not hard at all  Food Insecurity: No Food Insecurity (08/30/2023)   Hunger Vital Sign    Worried About Running Out of Food in the Last Year: Never true    Ran Out of Food in the Last Year: Never true  Transportation Needs: No Transportation Needs (08/30/2023)   PRAPARE - Administrator, Civil Service (Medical): No    Lack of Transportation (Non-Medical): No  Physical Activity: Insufficiently Active (08/30/2023)   Exercise Vital Sign    Days of Exercise per Week: 3 days    Minutes of Exercise per Session:  40 min  Stress: No Stress Concern Present (08/30/2023)   [employer] of Occupational Health - Occupational Stress Questionnaire    Feeling of Stress : Only a little  Social Connections: Socially Integrated (08/30/2023)   Social Connection and Isolation Panel [NHANES]    Frequency of Communication with Friends and Family: More than three times a week    Frequency of Social Gatherings with Friends and Family: Once a week    Attends Religious Services: More than 4 times per year    Active Member of [employer] or Organizations: Yes    Attends Engineer, Structural: More than 4 times per year    Marital Status: Married    Family History  Problem Relation Age of Onset   Obesity Mother    Asthma Mother    Colon cancer Maternal Great-grandmother    Inflammatory bowel disease Neg Hx    Celiac disease Neg Hx     Outpatient Encounter Medications as of 10/23/2023  Medication Sig   acetaminophen (TYLENOL) 500 MG tablet Take 1,000 mg by mouth every 8 (eight) hours as needed for moderate pain.   cholecalciferol (VITAMIN D3) 25 MCG (1000 UNIT) tablet Take 2,000 Units by mouth daily.   dicyclomine  (BENTYL ) 10 MG capsule Take 1 capsule (10 mg total) by mouth every 6 (six) hours as needed (do not  take with hyoscyamine ).   fexofenadine (ALLEGRA) 180 MG tablet Take 180 mg by mouth daily.     fluticasone (FLONASE) 50 MCG/ACT nasal spray Place 2 sprays into both nostrils daily as needed for allergies.   hyoscyamine  (LEVSIN  SL) 0.125 MG SL tablet Take 1 tablet (0.125 mg total) by mouth every 4 (four) hours as needed (do not take with dicylomine).   ibuprofen (ADVIL) 200 MG tablet Take 400 mg by mouth every 8 (eight) hours as needed for moderate pain.   levothyroxine  (SYNTHROID ) 75 MCG tablet Take 75 mcg by mouth See admin instructions. Take 75 mcg every day except on Fridays   Lysine 500 MG CAPS Take 500 mg by mouth daily.   Melatonin 2.5 MG CHEW Chew 2.5 mg by mouth at bedtime.   Multiple Vitamins-Minerals (MULTIVITAL) tablet Take 1 tablet by mouth daily.     ondansetron  (ZOFRAN ) 4 MG tablet TAKE 1 TABLET BY MOUTH EVERY 6 HOURS AS NEEDED FOR NAUSEA OR FOR VOMITING (Patient taking differently: Take 4 mg by mouth every 6 (six) hours as needed for vomiting or nausea.)   OVER THE COUNTER MEDICATION Take 1 tablet by mouth daily. Trace Minerals   Peppermint Oil (IBGARD PO) Take 1 capsule by mouth daily.   Probiotic Product (PROBIOTIC DAILY PO) Take 1 capsule by mouth daily. 50 billion   valACYclovir (VALTREX) 1000 MG tablet Take 1,000 mg by mouth daily as needed (For fever blister).   [DISCONTINUED] norethindrone  (MICRONOR ) 0.35 MG tablet Take 1 tablet (0.35 mg total) by mouth daily. (Patient not taking: Reported on 10/23/2023)   No facility-administered encounter medications on file as of 10/23/2023.    ALLERGIES: Allergies  Allergen Reactions   Rice     Throat swelling (certain type of rice)   Sporanox [Itraconazole] Rash   VACCINATION STATUS:  There is no immunization history on file for this patient.   HPI   Charlene Larson  is a patient with the above medical history. she was diagnosed with hypothyroidism at approximate age of 38 years (had miscarriage and T4 was checked and  noted to be low at that time), which required subsequent  initiation of thyroid  hormone replacement therapy. she was given various doses of Levothyroxine  over the years, currently on 75 micrograms (skipping 2 day of the week on Wednesdays and Saturdays-recently changed on 09/24/23). she reports compliance to this medication:  Taking it daily on empty stomach with water , separated by >30 minutes before breakfast and other medications, and by at least 4 hours from calcium, iron, PPIs, multivitamins.  She does take a women's MVI which contains Biotin and may have one other supplement with Biotin in it.  She notes the journey with her thyroid  has been a rocky one.  She cannot remember the last time her thyroid  was normal.  I reviewed patient's thyroid  tests:  Lab Results  Component Value Date   TSH 0.47 09/24/2023   TSH 0.5 10/28/2008   FREET4 1.12 10/28/2008     Pt describes: - fatigue - palpitations (resting) - anxiety - IBS with cramping - muscle aches  Pt denies feeling nodules in neck, hoarseness, dysphagia/odynophagia, SOB with lying down.  she denies any known family history of thyroid  disorders, no family history of thyroid  cancer.  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  No recent use of iodine supplements.    I reviewed her chart and she also has a history of anemia, low vitamin D, IBS with intense abdominal pain.   ROS:  Constitutional: no weight gain/loss, + fatigue, no subjective hyperthermia, no subjective hypothermia Eyes: no blurry vision, no xerophthalmia ENT: no sore throat, no nodules palpated in throat, no dysphagia/odynophagia, no hoarseness Cardiovascular: no chest pain, no SOB, + palpitations, no leg swelling Respiratory: no cough, no SOB Gastrointestinal: no nausea/vomiting/diarrhea, + intermittent abdominal pain Musculoskeletal: + diffuse generalized muscle/joint aches Skin:  no rashes Neurological: no tremors, no numbness, no tingling, no dizziness Psychiatric: no depression, + anxiety   Objective:   Objective     BP 119/78 (BP Location: Left Arm, Patient Position: Sitting, Cuff Size: Large)   Pulse 94   Ht 5' 4 (1.626 m)   Wt 144 lb 12.8 oz (65.7 kg)   BMI 24.85 kg/m  Wt Readings from Last 3 Encounters:  10/23/23 144 lb 12.8 oz (65.7 kg)  09/13/23 145 lb 14.4 oz (66.2 kg)  08/31/23 145 lb 9.6 oz (66 kg)    BP Readings from Last 3 Encounters:  10/23/23 119/78  09/13/23 116/68  08/31/23 115/78     Constitutional:  Body mass index is 24.85 kg/m., not in acute distress, normal state of mind Eyes: PERRLA, EOMI, no exophthalmos ENT: moist mucous membranes, + mild thyromegaly ?R>L, no palpable nodularity,  no cervical lymphadenopathy Cardiovascular: normal precordial activity, RRR, no murmur/rubs/gallops Respiratory:  adequate breathing efforts, no gross chest deformity, Clear to auscultation bilaterally Musculoskeletal: no gross deformities, strength intact in all four extremities Skin: moist, warm, no rashes Neurological: slight tremor with outstretched hands, deep tendon reflexes normal in BLE.   CMP ( most recent) CMP     Component Value Date/Time   NA 139 08/29/2023 0735   K 3.7 08/29/2023 0735   CL 104 08/29/2023 0735   CO2 26 08/29/2023 0735   GLUCOSE 100 (H) 08/29/2023 0735   BUN 12 08/29/2023 0735   CREATININE 0.71 08/29/2023 0735   CALCIUM 9.3 08/29/2023 0735   PROT 8.0 08/29/2023 0735   ALBUMIN 4.8 08/29/2023 0735   AST 19 08/29/2023 0735   ALT 21 08/29/2023 0735   ALKPHOS  40 08/29/2023 0735   BILITOT 0.6 08/29/2023 0735   GFRNONAA >60 08/29/2023 0735     Diabetic Labs (most recent): No results found for: HGBA1C, MICROALBUR   Lipid Panel ( most recent) Lipid Panel  No results found for: CHOL, TRIG, HDL, CHOLHDL, VLDL, LDLCALC, LDLDIRECT, LABVLDL     Lab Results  Component Value Date   TSH  0.47 09/24/2023   TSH 0.5 10/28/2008   FREET4 1.12 10/28/2008      Assessment & Plan:   ASSESSMENT / PLAN:  1. Hypothyroidism-unspecified   Patient with long-standing hypothyroidism, on levothyroxine  therapy. On physical exam, patient does not have gross goiter, thyroid  nodules, or neck compression symptoms.   She is advised to continue her current regimen of Levothyroxine  75 mcg daily skipping 2 days per week.  Need to give minimum of 6 weeks after dosage change before rechecking labs for effectiveness.  - We discussed about correct intake of levothyroxine , at fasting, with water , separated by at least 30 minutes from breakfast, and separated by more than 4 hours from calcium, iron, multivitamins, acid reflux medications (PPIs). -Patient is made aware of the fact that thyroid  hormone replacement is needed for life, dose to be adjusted by periodic monitoring of thyroid  function tests.  - Will check thyroid  tests before next visit: TSH, free T4, and thyroid  antibodies to help classify her dysfunction.  -Will also order thyroid  ultrasound to assess baseline anatomy.   - Time spent with the patient: 45 minutes, of which >50% was spent in obtaining information about her symptoms, reviewing her previous labs, evaluations, and treatments, counseling her about her hypothyroidism, and developing a plan to confirm the diagnosis and long term treatment as necessary. Please refer to Patient Self Inventory in the Media tab for reviewed elements of pertinent patient history.  Cecila  MALVA Fire participated in the discussions, expressed understanding, and voiced agreement with the above plans.  All questions were answered to her satisfaction. she is encouraged to contact clinic should she have any questions or concerns prior to her return visit.   FOLLOW UP PLAN:  Return will call with thyroid  labs and thyroid  ultrasound results, for Thyroid  follow up, Previsit labs, thyroid  ultrasound.  Benton Rio, [facility] [address] [place], [place] [address] Phone: [phone] Fax: [phone]  10/23/2023, 2:33 PM

## 2023-11-01 ENCOUNTER — Encounter: Payer: Self-pay | Admitting: Nurse Practitioner

## 2023-11-01 ENCOUNTER — Ambulatory Visit (HOSPITAL_COMMUNITY)
Admission: RE | Admit: 2023-11-01 | Discharge: 2023-11-01 | Disposition: A | Discharge status: Home / Self Care | Payer: BC Managed Care – PPO | Source: Ambulatory Visit | Attending: Nurse Practitioner | Admitting: Nurse Practitioner

## 2023-11-01 DIAGNOSIS — E039 Hypothyroidism, unspecified: Secondary | ICD-10-CM | POA: Insufficient documentation

## 2023-11-05 DIAGNOSIS — E039 Hypothyroidism, unspecified: Secondary | ICD-10-CM | POA: Diagnosis not present

## 2023-11-07 ENCOUNTER — Encounter: Payer: Self-pay | Admitting: Nurse Practitioner

## 2023-11-07 DIAGNOSIS — R102 Pelvic and perineal pain: Secondary | ICD-10-CM | POA: Diagnosis not present

## 2023-11-07 DIAGNOSIS — R1031 Right lower quadrant pain: Secondary | ICD-10-CM | POA: Diagnosis not present

## 2023-11-07 DIAGNOSIS — N939 Abnormal uterine and vaginal bleeding, unspecified: Secondary | ICD-10-CM | POA: Diagnosis not present

## 2023-11-07 DIAGNOSIS — N951 Menopausal and female climacteric states: Secondary | ICD-10-CM | POA: Diagnosis not present

## 2023-11-07 DIAGNOSIS — E039 Hypothyroidism, unspecified: Secondary | ICD-10-CM

## 2023-11-07 LAB — THYROID PEROXIDASE ANTIBODY: Thyroperoxidase Ab SerPl-aCnc: 23 [IU]/mL (ref 0–34)

## 2023-11-07 LAB — THYROGLOBULIN ANTIBODY

## 2023-11-07 LAB — T4, FREE: Free T4: 1.36 ng/dL (ref 0.82–1.77)

## 2023-11-07 LAB — TSH: TSH: 0.438 u[IU]/mL — ABNORMAL LOW (ref 0.450–4.500)

## 2023-11-07 MED ORDER — LEVOTHYROXINE SODIUM 50 MCG PO TABS: 50.0000 ug | ORAL_TABLET | Freq: Every day | ORAL | 1 refills | Status: DC

## 2023-11-07 NOTE — Progress Notes (Signed)
 FYI: I sent mychart message going over recent thyroid labs

## 2023-11-07 NOTE — Progress Notes (Signed)
 Hey Charlene Larson ,  This is [name], Surveyor, mining. She has put the orders in. When you go to [employer] [employer] they should be able to see these orders. Or if you decide to call about making an appointment, they should be able to see them,

## 2023-11-08 ENCOUNTER — Other Ambulatory Visit: Payer: Self-pay | Admitting: Gastroenterology

## 2023-11-19 MED ORDER — LEVOTHYROXINE SODIUM 50 MCG PO TABS: 50.0000 ug | ORAL_TABLET | Freq: Every day | ORAL | 1 refills | Status: DC

## 2023-11-19 NOTE — Addendum Note (Signed)
Addended by: Dani Gobble on: 11/19/2023 08:08 AM   Modules accepted: Orders

## 2023-11-21 ENCOUNTER — Other Ambulatory Visit (HOSPITAL_COMMUNITY): Payer: Self-pay | Admitting: Family

## 2023-11-21 DIAGNOSIS — N939 Abnormal uterine and vaginal bleeding, unspecified: Secondary | ICD-10-CM

## 2023-11-22 DIAGNOSIS — N8003 Adenomyosis of the uterus: Secondary | ICD-10-CM | POA: Diagnosis not present

## 2023-11-22 DIAGNOSIS — N939 Abnormal uterine and vaginal bleeding, unspecified: Secondary | ICD-10-CM | POA: Diagnosis not present

## 2023-11-22 DIAGNOSIS — N839 Noninflammatory disorder of ovary, fallopian tube and broad ligament, unspecified: Secondary | ICD-10-CM | POA: Diagnosis not present

## 2023-11-22 MED ORDER — LEVOTHYROXINE SODIUM 50 MCG PO TABS: 50.0000 ug | ORAL_TABLET | Freq: Every day | ORAL | 1 refills | Status: DC

## 2023-11-22 NOTE — Addendum Note (Signed)
Addended by: Dani Gobble on: 11/22/2023 10:41 AM   Modules accepted: Orders

## 2023-12-17 ENCOUNTER — Ambulatory Visit (INDEPENDENT_AMBULATORY_CARE_PROVIDER_SITE_OTHER): Payer: BC Managed Care – PPO | Admitting: Gastroenterology

## 2023-12-17 ENCOUNTER — Encounter (INDEPENDENT_AMBULATORY_CARE_PROVIDER_SITE_OTHER): Payer: Self-pay | Admitting: Gastroenterology

## 2023-12-17 VITALS — BP 108/68 | HR 72 | Temp 98.4°F | Ht 64.0 in | Wt 143.4 lb

## 2023-12-17 DIAGNOSIS — K580 Irritable bowel syndrome with diarrhea: Secondary | ICD-10-CM

## 2023-12-17 DIAGNOSIS — G8929 Other chronic pain: Secondary | ICD-10-CM

## 2023-12-17 DIAGNOSIS — R1031 Right lower quadrant pain: Secondary | ICD-10-CM | POA: Diagnosis not present

## 2023-12-17 NOTE — Progress Notes (Signed)
 [name], M.D. Gastroenterology & Hepatology [facility] [address] [place], [place] [address]  Primary Care Physician: Lawerance Sabal, [place] [address] [place] [address]  I will communicate my assessment and recommendations to the referring MD via EMR.  Problems: Right lower quadrant abdominal and pelvic pain   History of Present Illness: Charlene Larson is a 48 y.o. female with past medical history of  IBS-D, GERD , who presents for follow up of right lower quadrant abdominal pain.  The patient was last seen on 09/13/2023. At that time, the patient was counseled to follow-up with GYN regarding abdominopelvic pain.  We also discussed the possibility of using low-dose Elavil if pain persisted.  The patient has been followed by GYN.  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 She was started on hormone replacement therapy for perimenopausal symptoms.  Patient reports that her pelvic pain has improved substantially. She reports that she is now having intermittent pain in the RLQ/pelvic area, which is now 2/10. She takes Tylenol or ibuprofen as needed, Levsin may also help if its very severe but causes significant sleepiness.  She is taking Bentyl 10 mg every day consistently.  She is usually having 2-4 watery bowel movements per day. No nighttime symptoms that wake her up in the middle of the night. Very seldom takes Imodium.  The patient denies having any nausea, vomiting, fever, chills, hematochezia, melena, hematemesis, abdominal distention, abdominal pain, jaundice, pruritus or weight loss.  Last EGD: at [facility] in 2009, patietn believes it was normal but no reports are available   Last Colonoscopy: 10/13/2022 - The examined portion of the ileum was normal. - Three 4 to 6 mm polyps in the ascending colon and in the cecum,  removed with a cold snare. Resected and retrieved. - The rest of the examined colon is normal. Biopsied. - Non- bleeding internal hemorrhoids.   Pathology showed three colonic tubular adenoma and normal random colonic biopsies, repeat colonoscopy in 5 years   Past Medical History: Past Medical History:  Diagnosis Date   Abdominal pain    intermittent; IBS-D   FHx: allergies    pt does have allergies   IBS (irritable bowel syndrome)    Diarrhea type   Non-celiac gluten sensitivity    Reflux    Spontaneous pneumothorax    x2   Thyroid disease     Past Surgical History: Past Surgical History:  Procedure Laterality Date   BIOPSY  10/13/2022   Procedure: BIOPSY;  Surgeon: Dolores Frame, MD;  [facility];  Service: Gastroenterology;;   COLONOSCOPY WITH PROPOFOL N/A 10/13/2022   Procedure: COLONOSCOPY WITH PROPOFOL;  Surgeon: Dolores Frame, MD;  [facility];  Service: Gastroenterology;  Laterality: N/A;  8:15 am   D&C with miscarriage     HERNIA REPAIR     HYSTEROSCOPY W/ ENDOMETRIAL ABLATION     POLYPECTOMY  10/13/2022   Procedure: POLYPECTOMY INTESTINAL;  Surgeon: Dolores Frame, MD;  [facility];  Service: Gastroenterology;;   repair of broken jaw     right hand usrgery     SALPINGECTOMY     TONSILLECTOMY AND ADENOIDECTOMY      Family History: Family History  Problem Relation Age of Onset   Obesity Mother    Asthma Mother    Colon cancer Maternal Great-grandmother    Inflammatory bowel  disease Neg Hx    Celiac disease Neg Hx     Social History: Social History   Tobacco Use  Smoking Status Never  Smokeless Tobacco Never   Social History   Substance and Sexual Activity  Alcohol Use No   Social History   Substance and Sexual Activity  Drug Use Never    Allergies: Allergies  Allergen Reactions   Rice     Throat swelling (certain type of rice)   Sporanox [Itraconazole] Rash     Medications: Current Outpatient Medications  Medication Sig Dispense Refill   acetaminophen (TYLENOL) 500 MG tablet Take 1,000 mg by mouth every 8 (eight) hours as needed for moderate pain.     cholecalciferol (VITAMIN D3) 25 MCG (1000 UNIT) tablet Take 2,000 Units by mouth daily.     dicyclomine (BENTYL) 10 MG capsule Take 1 capsule (10 mg total) by mouth every 6 (six) hours as needed (do not take with hyoscyamine). 90 capsule 2   estradiol (CLIMARA - DOSED IN MG/24 HR) 0.05 mg/24hr patch Place 0.05 mg onto the skin. Two time per week per patient.     fexofenadine (ALLEGRA) 180 MG tablet Take 180 mg by mouth daily.       fluticasone (FLONASE) 50 MCG/ACT nasal spray Place 2 sprays into both nostrils daily as needed for allergies.     hyoscyamine (LEVSIN SL) 0.125 MG SL tablet Take 1 tablet (0.125 mg total) by mouth every 4 (four) hours as needed (do not take with dicylomine). 90 tablet 2   ibuprofen (ADVIL) 200 MG tablet Take 400 mg by mouth every 8 (eight) hours as needed for moderate pain.     levothyroxine (SYNTHROID) 50 MCG tablet Take 1 tablet (50 mcg total) by mouth daily before breakfast. 90 tablet 1   Lysine 500 MG CAPS Take 500 mg by mouth daily.     Melatonin 2.5 MG CHEW Chew 2.5 mg by mouth at bedtime.     Multiple Vitamins-Minerals (MULTIVITAL) tablet Take 1 tablet by mouth daily.       ondansetron (ZOFRAN) 4 MG tablet TAKE 1 TABLET BY MOUTH EVERY 6 HOURS AS NEEDED FOR NAUSEA OR VOMITING. 30 tablet 2   OVER THE COUNTER MEDICATION Take 1 tablet by mouth daily. Trace Minerals     Peppermint Oil (IBGARD PO) Take 1 capsule by mouth daily.     Probiotic Product (PROBIOTIC DAILY PO) Take 1 capsule by mouth daily. 50 billion     progesterone (PROMETRIUM) 100 MG capsule Take 100 mg by mouth at bedtime.     valACYclovir (VALTREX) 1000 MG tablet Take 1,000 mg by mouth daily as needed (For fever blister).     No current facility-administered medications for this visit.    Review of  Systems: GENERAL: negative for malaise, night sweats HEENT: No changes in hearing or vision, no nose bleeds or other nasal problems. NECK: Negative for lumps, goiter, pain and significant neck swelling RESPIRATORY: Negative for cough, wheezing CARDIOVASCULAR: Negative for chest pain, leg swelling, palpitations, orthopnea GI: SEE HPI MUSCULOSKELETAL: Negative for joint pain or swelling, back pain, and muscle pain. SKIN: Negative for lesions, rash PSYCH: Negative for sleep disturbance, mood disorder and recent psychosocial stressors. HEMATOLOGY Negative for prolonged bleeding, bruising easily, and swollen nodes. ENDOCRINE: Negative for cold or heat intolerance, polyuria, polydipsia and goiter. NEURO: negative for tremor, gait imbalance, syncope and seizures. The remainder of the review of systems is noncontributory.   Physical Exam: BP 108/68 (BP Location: Left Arm, Patient Position:  Sitting, Cuff Size: Normal)   Pulse 72   Temp 98.4 F (36.9 C) (Temporal)   Ht 5\' 4"  (1.626 m)   Wt 143 lb 6.4 oz (65 kg)   BMI 24.61 kg/m  GENERAL: The patient is AO x3, in no acute distress. HEENT: Head is normocephalic and atraumatic. EOMI are intact. Mouth is well hydrated and without lesions. NECK: Supple. No masses LUNGS: Clear to auscultation. No presence of rhonchi/wheezing/rales. Adequate chest expansion HEART: RRR, normal s1 and s2. ABDOMEN: Mildly tender upon palpation of the right lower quadrant, no guarding, no peritoneal signs, and nondistended. BS +. No masses. EXTREMITIES: Without any cyanosis, clubbing, rash, lesions or edema. NEUROLOGIC: AOx3, no focal motor deficit. SKIN: no jaundice, no rashes  Imaging/Labs: as above  I personally reviewed and interpreted the available labs, imaging and endoscopic files.  Impression and Plan: Charlene Larson is a 48 y.o. female with past medical history of  IBS-D, GERD , who presents for follow up of right lower quadrant abdominal pain.   Patient has pending improvement of her abdominal pain recently and denies having any complaints.  It appears that her symptoms are likely related to gynecologic/post ablation etiology.  She  would like to hold off on proceeding with invasive surgery unless instructed necessary which I find reasonable.  She will follow-up with GYN if her symptoms worsen.  For now, she will continue taking Bentyl 10 mg daily and only taking Levsin if the pain is very severe.  -Continue Bentyl 10 mg daily -If taking Levsin as needed for abdominal pain, take it at least 4 hours apart from Bentyl -If recurrent abdominal pain, follow up with GYN  All questions were answered.      [name], MD Gastroenterology and Hepatology [facility] Gastroenterology

## 2023-12-17 NOTE — Patient Instructions (Signed)
 Continue Bentyl 10 mg daily If taking Levsin as needed for abdominal pain, take it at least 4 hours apart from Bentyl If recurrent abdominal pain, follow up with GYN

## 2023-12-21 ENCOUNTER — Ambulatory Visit
Admission: RE | Admit: 2023-12-21 | Discharge: 2023-12-21 | Disposition: A | Discharge status: Home / Self Care | Payer: BC Managed Care – PPO | Source: Ambulatory Visit | Attending: Obstetrics and Gynecology | Admitting: Obstetrics and Gynecology

## 2023-12-21 DIAGNOSIS — Z1231 Encounter for screening mammogram for malignant neoplasm of breast: Secondary | ICD-10-CM | POA: Diagnosis not present

## 2023-12-26 ENCOUNTER — Other Ambulatory Visit: Payer: Self-pay | Admitting: Obstetrics and Gynecology

## 2023-12-26 DIAGNOSIS — R928 Other abnormal and inconclusive findings on diagnostic imaging of breast: Secondary | ICD-10-CM

## 2023-12-27 ENCOUNTER — Ambulatory Visit
Admission: RE | Admit: 2023-12-27 | Discharge: 2023-12-27 | Disposition: A | Discharge status: Home / Self Care | Source: Ambulatory Visit | Attending: Obstetrics and Gynecology | Admitting: Obstetrics and Gynecology

## 2023-12-27 DIAGNOSIS — N6001 Solitary cyst of right breast: Secondary | ICD-10-CM | POA: Diagnosis not present

## 2023-12-27 DIAGNOSIS — R928 Other abnormal and inconclusive findings on diagnostic imaging of breast: Secondary | ICD-10-CM

## 2023-12-31 DIAGNOSIS — E039 Hypothyroidism, unspecified: Secondary | ICD-10-CM | POA: Diagnosis not present

## 2024-01-02 ENCOUNTER — Encounter: Payer: Self-pay | Admitting: Nurse Practitioner

## 2024-01-02 ENCOUNTER — Other Ambulatory Visit: Payer: Self-pay | Admitting: Nurse Practitioner

## 2024-01-02 DIAGNOSIS — E039 Hypothyroidism, unspecified: Secondary | ICD-10-CM

## 2024-01-02 LAB — T4, FREE: Free T4: 1.45 ng/dL (ref 0.82–1.77)

## 2024-01-02 LAB — TSH: TSH: 0.712 u[IU]/mL (ref 0.450–4.500)

## 2024-01-02 NOTE — Progress Notes (Signed)
 Can you please reach out to patient to schedule a 4 month follow up with previsit labs?  Thanks

## 2024-01-02 NOTE — Progress Notes (Signed)
 Can you schedule patient for follow up in office in 4 months with previsit labs?  Thanks

## 2024-01-08 DIAGNOSIS — E7889 Other lipoprotein metabolism disorders: Secondary | ICD-10-CM | POA: Diagnosis not present

## 2024-01-08 DIAGNOSIS — E039 Hypothyroidism, unspecified: Secondary | ICD-10-CM | POA: Diagnosis not present

## 2024-01-22 DIAGNOSIS — M2550 Pain in unspecified joint: Secondary | ICD-10-CM | POA: Diagnosis not present

## 2024-01-22 DIAGNOSIS — F520 Hypoactive sexual desire disorder: Secondary | ICD-10-CM | POA: Diagnosis not present

## 2024-01-22 DIAGNOSIS — N951 Menopausal and female climacteric states: Secondary | ICD-10-CM | POA: Diagnosis not present

## 2024-02-06 ENCOUNTER — Other Ambulatory Visit: Payer: Self-pay | Admitting: Gastroenterology

## 2024-02-06 NOTE — Telephone Encounter (Signed)
 Patient seen by Dr. Sammi Crick the last two times. I will provide limited refills. Future refills to come from Dr. Tita Form.

## 2024-02-19 DIAGNOSIS — N951 Menopausal and female climacteric states: Secondary | ICD-10-CM | POA: Diagnosis not present

## 2024-02-19 DIAGNOSIS — E039 Hypothyroidism, unspecified: Secondary | ICD-10-CM | POA: Diagnosis not present

## 2024-02-19 DIAGNOSIS — F520 Hypoactive sexual desire disorder: Secondary | ICD-10-CM | POA: Diagnosis not present

## 2024-02-19 DIAGNOSIS — M2550 Pain in unspecified joint: Secondary | ICD-10-CM | POA: Diagnosis not present

## 2024-04-14 DIAGNOSIS — R635 Abnormal weight gain: Secondary | ICD-10-CM | POA: Diagnosis not present

## 2024-04-14 DIAGNOSIS — Z1321 Encounter for screening for nutritional disorder: Secondary | ICD-10-CM | POA: Diagnosis not present

## 2024-04-14 DIAGNOSIS — E559 Vitamin D deficiency, unspecified: Secondary | ICD-10-CM | POA: Diagnosis not present

## 2024-04-14 DIAGNOSIS — E538 Deficiency of other specified B group vitamins: Secondary | ICD-10-CM | POA: Diagnosis not present

## 2024-04-14 DIAGNOSIS — N951 Menopausal and female climacteric states: Secondary | ICD-10-CM | POA: Diagnosis not present

## 2024-04-14 DIAGNOSIS — E039 Hypothyroidism, unspecified: Secondary | ICD-10-CM | POA: Diagnosis not present

## 2024-04-14 DIAGNOSIS — E639 Nutritional deficiency, unspecified: Secondary | ICD-10-CM | POA: Diagnosis not present

## 2024-04-14 DIAGNOSIS — R5383 Other fatigue: Secondary | ICD-10-CM | POA: Diagnosis not present

## 2024-04-21 ENCOUNTER — Other Ambulatory Visit: Payer: Self-pay | Admitting: Gastroenterology

## 2024-04-26 ENCOUNTER — Other Ambulatory Visit: Payer: Self-pay | Admitting: Nurse Practitioner

## 2024-04-30 ENCOUNTER — Encounter: Payer: Self-pay | Admitting: Nurse Practitioner

## 2024-05-01 DIAGNOSIS — E039 Hypothyroidism, unspecified: Secondary | ICD-10-CM | POA: Diagnosis not present

## 2024-05-02 LAB — TSH: TSH: 0.807 u[IU]/mL (ref 0.450–4.500)

## 2024-05-02 LAB — T4, FREE: Free T4: 1.3 ng/dL (ref 0.82–1.77)

## 2024-05-05 ENCOUNTER — Encounter: Payer: Self-pay | Admitting: Nurse Practitioner

## 2024-05-05 ENCOUNTER — Ambulatory Visit (INDEPENDENT_AMBULATORY_CARE_PROVIDER_SITE_OTHER): Admitting: Nurse Practitioner

## 2024-05-05 VITALS — BP 104/70 | HR 72 | Ht 64.0 in | Wt 148.0 lb

## 2024-05-05 DIAGNOSIS — E039 Hypothyroidism, unspecified: Secondary | ICD-10-CM

## 2024-05-05 MED ORDER — LEVOTHYROXINE SODIUM 50 MCG PO TABS: 50.0000 ug | ORAL_TABLET | Freq: Every day | ORAL | 3 refills | Status: AC

## 2024-05-05 NOTE — Progress Notes (Signed)
 Endocrinology Follow Up Note                                         05/05/2024, 11:36 AM  Subjective:   Subjective    Charlene Larson is a 48 y.o.-year-old female patient being seen in follow up after being seen in consultation for hypothyroidism referred by Job Bolt, [place].   Past Medical History:  Diagnosis Date   Abdominal pain    intermittent; IBS-D   FHx: allergies    pt does have allergies   IBS (irritable bowel syndrome)    Diarrhea type   Non-celiac gluten sensitivity    Reflux    Spontaneous pneumothorax    x2   Thyroid  disease     Past Surgical History:  Procedure Laterality Date   BIOPSY  10/13/2022   Procedure: BIOPSY;  Surgeon: Eartha Angelia Sieving, MD;  [facility]: [facility];  Service: Gastroenterology;;   COLONOSCOPY WITH PROPOFOL  N/A 10/13/2022   Procedure: COLONOSCOPY WITH PROPOFOL ;  Surgeon: Eartha Angelia Sieving, MD;  Location: [facility];  Service: Gastroenterology;  Laterality: N/A;  8:15 am   D&C with miscarriage     HERNIA REPAIR     HYSTEROSCOPY W/ ENDOMETRIAL ABLATION     POLYPECTOMY  10/13/2022   Procedure: POLYPECTOMY INTESTINAL;  Surgeon: Eartha Angelia Sieving, MD;  Location: [facility];  Service: Gastroenterology;;   repair of broken jaw     right hand usrgery     SALPINGECTOMY     TONSILLECTOMY AND ADENOIDECTOMY      Social History   Socioeconomic History   Marital status: Married    Spouse name: Not on file   Number of children: Not on file   Years of education: Not on file   Highest education level: Not on file  Occupational History   Not on file  Tobacco Use   Smoking status: Never   Smokeless tobacco: Never  Vaping Use   Vaping status: Never Used  Substance and Sexual Activity   Alcohol use: No   Drug use: Never   Sexual activity: Yes    Birth control/protection: Surgical  Other Topics Concern   Not on file  Social History  Narrative   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    Social Drivers of Corporate investment banker Strain: Low Risk  (01/22/2024)   Received from [facility]   Overall Financial Resource Strain (CARDIA)    Difficulty of Paying Living Expenses: Not very hard  Food Insecurity: No Food Insecurity (01/22/2024)   Received [facility]   Hunger Vital Sign    Within the past 12 months, you worried that your food would run out before you got the money to buy more.: Never true    Within the past 12 months, the food you bought just didn't last and you didn't have money to get more.: Never true  Transportation Needs: No Transportation Needs (01/22/2024)   Received from [facility]  PRAPARE - Administrator, Civil Service (Medical): No    Lack of Transportation (Non-Medical): No  Physical Activity: Sufficiently Active (01/22/2024)   Received from [facility]   Exercise Vital Sign    On average, how many days per week do you engage in moderate to strenuous exercise (like a brisk walk)?: 7 days    On average, how many minutes do you engage in exercise at this level?: 90 min  Stress: No Stress Concern Present (01/22/2024)   Received from [facility] - Occupational Stress Questionnaire    Feeling of Stress : Not at all  Social Connections: Socially Integrated (01/22/2024)   Received from [facility]   Social Connection and Isolation Panel    In a typical week, how many times do you talk on the phone with family, friends, or neighbors?: Twice a week    How often do you get together with friends or relatives?: Twice a week    How often do you attend church or religious services?: 1 to 4 times per year    Do you belong to any clubs or organizations such as church groups, unions, fraternal or athletic groups, or school groups?: Yes    How often do you attend meetings of the clubs or organizations you belong to?: 1 to 4 times per  year    Are you married, widowed, divorced, separated, never married, or living with a partner?: Married    Family History  Problem Relation Age of Onset   Obesity Mother    Asthma Mother    Colon cancer Maternal Great-grandmother    Inflammatory bowel disease Neg Hx    Celiac disease Neg Hx     Outpatient Encounter Medications as of 05/05/2024  Medication Sig   acetaminophen (TYLENOL) 500 MG tablet Take 1,000 mg by mouth every 8 (eight) hours as needed for moderate pain.   cholecalciferol (VITAMIN D3) 25 MCG (1000 UNIT) tablet Take 2,000 Units by mouth daily.   dicyclomine  (BENTYL ) 10 MG capsule TAKE 1 CAPSULE BY MOUTH EVERY 6 HOURS AS NEEDED (DO NOT TAKE WITH Hyoscyamine )   DOTTI  0.075 MG/24HR Place 1 patch onto the skin 2 (two) times a week.   estradiol  (ESTRACE ) 0.1 MG/GM vaginal cream Place 1 Applicatorful vaginally daily. For 2 weeks   fexofenadine (ALLEGRA) 180 MG tablet Take 180 mg by mouth daily.     fluticasone (FLONASE) 50 MCG/ACT nasal spray Place 2 sprays into both nostrils daily as needed for allergies.   hyoscyamine  (LEVSIN  SL) 0.125 MG SL tablet Take 1 tablet (0.125 mg total) by mouth every 4 (four) hours as needed (do not take with dicylomine).   ibuprofen (ADVIL) 200 MG tablet Take 400 mg by mouth every 8 (eight) hours as needed for moderate pain.   Lysine 500 MG CAPS Take 500 mg by mouth daily.   Multiple Vitamins-Minerals (MULTIVITAL) tablet Take 1 tablet by mouth daily.     ondansetron  (ZOFRAN ) 4 MG tablet TAKE 1 TABLET BY MOUTH EVERY 6 HOURS AS NEEDED FOR NAUSEA AND VOMITING (Patient taking differently: as needed.)   OVER THE COUNTER MEDICATION Take 1 tablet by mouth daily. Trace Minerals   Peppermint Oil (IBGARD PO) Take 1 capsule by mouth daily. (Patient taking differently: Take 1 capsule by mouth daily. As needed)   Probiotic Product (PROBIOTIC DAILY PO) Take 1 capsule by mouth daily. 50 billion   valACYclovir (VALTREX) 1000 MG tablet Take 1,000 mg  by mouth daily  as needed (For fever blister).   [DISCONTINUED] levothyroxine  (SYNTHROID ) 50 MCG tablet TAKE 1 TABLET BY MOUTH DAILY BEFORE BREAKFAST   [DISCONTINUED] progesterone (PROMETRIUM) 100 MG capsule Take 100 mg by mouth at bedtime. (Patient taking differently: Take 200 mg by mouth at bedtime.)   estradiol  (CLIMARA  - DOSED IN MG/24 HR) 0.05 mg/24hr patch Place 0.05 mg onto the skin. Two time per week per patient. (Patient not taking: Reported on 05/05/2024)   levothyroxine  (SYNTHROID ) 50 MCG tablet Take 1 tablet (50 mcg total) by mouth daily before breakfast.   progesterone (PROMETRIUM) 200 MG capsule Take 200 mg by mouth at bedtime.   [DISCONTINUED] Melatonin 2.5 MG CHEW Chew 2.5 mg by mouth at bedtime. (Patient not taking: Reported on 05/05/2024)   No facility-administered encounter medications on file as of 05/05/2024.    ALLERGIES: Allergies  Allergen Reactions   Rice     Throat swelling (certain type of rice)   Sporanox [Itraconazole] Rash   VACCINATION STATUS:  There is no immunization history on file for this patient.   HPI   Charlene Larson  is a patient with the above medical history. she was diagnosed with hypothyroidism at approximate age of 10 years (had miscarriage and T4 was checked and noted to be low at that time), which required subsequent initiation of thyroid  hormone replacement therapy. she was given various doses of Levothyroxine  over the years, currently on 75 micrograms (skipping 2 day of the week on Wednesdays and Saturdays-recently changed on 09/24/23). she reports compliance to this medication:  Taking it daily on empty stomach with water , separated by >30 minutes before breakfast and other medications, and by at least 4 hours from calcium, iron, PPIs, multivitamins.  She does take a women's MVI which contains Biotin and may have one other supplement with Biotin in it.  She notes the journey with her thyroid  has been a rocky one.  She cannot remember the last time her  thyroid  was normal.  I reviewed patient's thyroid  tests:  Lab Results  Component Value Date   TSH 0.807 05/01/2024   TSH 0.712 12/31/2023   TSH 0.438 (L) 11/05/2023   TSH 0.47 09/24/2023   TSH 0.5 10/28/2008   FREET4 1.30 05/01/2024   FREET4 1.45 12/31/2023   FREET4 1.36 11/05/2023   FREET4 1.12 10/28/2008   Pt denies feeling nodules in neck, hoarseness, dysphagia/odynophagia, SOB with lying down.  she denies any known family history of thyroid  disorders, no family history of thyroid  cancer.  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  No recent use of iodine supplements.    I reviewed her chart and she also has a history of anemia, low vitamin D, IBS with intense abdominal pain.   ROS:  Constitutional: no weight gain/loss, + fatigue-improved somewhat, no subjective hyperthermia, no subjective hypothermia, recurrent headaches (3 a week) Eyes: no blurry vision, no xerophthalmia ENT: no sore throat, no nodules palpated in throat, no dysphagia/odynophagia, no hoarseness Cardiovascular: no chest pain, no SOB, + palpitations-intermittent (resting HR this morning at home was in 130 range), no leg swelling Respiratory: no cough, no SOB Gastrointestinal: no nausea/vomiting/diarrhea, + intermittent abdominal pain-hx IBS Musculoskeletal: + diffuse generalized muscle/joint aches Skin: no rashes Neurological: no tremors, no numbness, no tingling, + dizziness Psychiatric: no depression, + anxiety   Objective:   Objective     BP 104/70 (BP Location: Left  Arm, Patient Position: Sitting, Cuff Size: Large)   Pulse 72   Ht 5' 4 (1.626 m)   Wt 148 lb (67.1 kg)   BMI 25.40 kg/m  Wt Readings from Last 3 Encounters:  05/05/24 148 lb (67.1 kg)  12/17/23 143 lb 6.4 oz (65 kg)  10/23/23 144 lb 12.8 oz (65.7 kg)    BP Readings from Last 3 Encounters:  05/05/24 104/70  12/17/23 108/68  10/23/23 119/78      Physical Exam- Limited  Constitutional:  Body mass index is 25.4 kg/m. , not in acute distress, normal state of mind Eyes:  EOMI, no exophthalmos Cardiovascular: RRR, no murmurs, rubs, or gallops, no edema Musculoskeletal: no gross deformities, strength intact in all four extremities, no gross restriction of joint movements Skin:  no rashes, no hyperemia Neurological: no tremor with outstretched hands   CMP ( most recent) CMP     Component Value Date/Time   NA 139 08/29/2023 0735   K 3.7 08/29/2023 0735   CL 104 08/29/2023 0735   CO2 26 08/29/2023 0735   GLUCOSE 100 (H) 08/29/2023 0735   BUN 12 08/29/2023 0735   CREATININE 0.71 08/29/2023 0735   CALCIUM 9.3 08/29/2023 0735   PROT 8.0 08/29/2023 0735   ALBUMIN 4.8 08/29/2023 0735   AST 19 08/29/2023 0735   ALT 21 08/29/2023 0735   ALKPHOS 40 08/29/2023 0735   BILITOT 0.6 08/29/2023 0735   GFRNONAA >60 08/29/2023 0735     Diabetic Labs (most recent): No results found for: HGBA1C, MICROALBUR   Lipid Panel ( most recent) Lipid Panel  No results found for: CHOL, TRIG, HDL, CHOLHDL, VLDL, LDLCALC, LDLDIRECT, LABVLDL     Lab Results  Component Value Date   TSH 0.807 05/01/2024   TSH 0.712 12/31/2023   TSH 0.438 (L) 11/05/2023   TSH 0.47 09/24/2023   TSH 0.5 10/28/2008   FREET4 1.30 05/01/2024   FREET4 1.45 12/31/2023   FREET4 1.36 11/05/2023   FREET4 1.12 10/28/2008    Thyroid  ultrasound from 11/01/23 CLINICAL DATA:  Hypothyroid.   EXAM: THYROID  ULTRASOUND   TECHNIQUE: Ultrasound examination of the thyroid  gland and adjacent soft tissues was performed.   COMPARISON:  None Available.   FINDINGS: Parenchymal Echotexture: Normal   Isthmus: 0.2 cm   Right lobe: 4.2 x 1.1 x 1.3 cm   Left lobe: 5.1 x 1.3 x 1.5 cm   _________________________________________________________   Estimated total number of nodules >/= 1 cm: 0   Number of spongiform nodules >/=  2 cm not described  below (TR1): 0   Number of mixed cystic and solid nodules >/= 1.5 cm not described below (TR2): 0   _________________________________________________________   No discrete nodules are seen within the thyroid  gland.   IMPRESSION: Normal sonographic appearance of the thyroid  gland.     Electronically Signed   By: Wilkie Lent M.D.   On: 11/01/2023 16:02     Latest Reference Range & Units 10/28/08 00:00 09/24/23 00:00 11/05/23 12:13 12/31/23 10:52 05/01/24 12:26  TSH 0.450 - 4.500 uIU/mL 0.5 0.47 (E) 0.438 (L) 0.712 0.807  T4,Free(Direct) 0.82 - 1.77 ng/dL 8.87  8.63 8.54 8.69  Thyroperoxidase Ab SerPl-aCnc 0 - 34 IU/mL   23    Thyroglobulin Antibody 0.0 - 0.9 IU/mL   <1.0    (L): Data is abnormally low (E): External lab result  Assessment & Plan:   ASSESSMENT / PLAN:  1. Hypothyroidism-acquired   Patient with long-standing hypothyroidism, on levothyroxine  therapy. On physical  exam, patient does not have gross goiter, thyroid  nodules, or neck compression symptoms.     Her previsit TFTs are consistent with appropriate hormone replacement.  She is advised to continue her current regimen of Levothyroxine  50 mcg daily.  It does not appear that her thyroid  is the main cause of her recent symptoms.  - We discussed about correct intake of levothyroxine , at fasting, with water , separated by at least 30 minutes from breakfast, and separated by more than 4 hours from calcium, iron, multivitamins, acid reflux medications (PPIs). -Patient is made aware of the fact that thyroid  hormone replacement is needed for life, dose to be adjusted by periodic monitoring of thyroid  function tests.  -She has established care with [facility], sees them to discuss recent labs this Wednesday.  She notes she is undergoing treatment for menopause.  Her main complaint today is recurrent headaches and dizziness.   I spent  36  minutes in the care of the patient today including review of  labs from Thyroid  Function, CMP, and other relevant labs ; imaging/biopsy records (current and previous including abstractions from other facilities); face-to-face time discussing  her lab results and symptoms, medications doses, her options of short and long term treatment based on the latest standards of care / guidelines;   and documenting the encounter.  Kiosha  MALVA Fire  participated in the discussions, expressed understanding, and voiced agreement with the above plans.  All questions were answered to her satisfaction. she is encouraged to contact clinic should she have any questions or concerns prior to her return visit.   FOLLOW UP PLAN:  Return in about 4 months (around 09/05/2024) for Thyroid  follow up, Previsit labs.  Benton Rio, [facility] [address] [place], [place] [address] Phone: [phone] Fax: [phone]  05/05/2024, 11:36 AM

## 2024-05-05 NOTE — Patient Instructions (Signed)

## 2024-05-07 DIAGNOSIS — E559 Vitamin D deficiency, unspecified: Secondary | ICD-10-CM | POA: Diagnosis not present

## 2024-05-07 DIAGNOSIS — N951 Menopausal and female climacteric states: Secondary | ICD-10-CM | POA: Diagnosis not present

## 2024-05-07 DIAGNOSIS — K580 Irritable bowel syndrome with diarrhea: Secondary | ICD-10-CM | POA: Diagnosis not present

## 2024-05-07 DIAGNOSIS — E039 Hypothyroidism, unspecified: Secondary | ICD-10-CM | POA: Diagnosis not present

## 2024-05-07 DIAGNOSIS — E538 Deficiency of other specified B group vitamins: Secondary | ICD-10-CM | POA: Diagnosis not present

## 2024-06-17 ENCOUNTER — Other Ambulatory Visit: Payer: Self-pay | Admitting: Gastroenterology

## 2024-08-06 ENCOUNTER — Encounter (INDEPENDENT_AMBULATORY_CARE_PROVIDER_SITE_OTHER): Payer: Self-pay | Admitting: Gastroenterology

## 2024-08-06 DIAGNOSIS — E559 Vitamin D deficiency, unspecified: Secondary | ICD-10-CM | POA: Diagnosis not present

## 2024-08-06 DIAGNOSIS — E039 Hypothyroidism, unspecified: Secondary | ICD-10-CM | POA: Diagnosis not present

## 2024-08-06 DIAGNOSIS — R5383 Other fatigue: Secondary | ICD-10-CM | POA: Diagnosis not present

## 2024-08-06 DIAGNOSIS — R635 Abnormal weight gain: Secondary | ICD-10-CM | POA: Diagnosis not present

## 2024-08-06 DIAGNOSIS — N951 Menopausal and female climacteric states: Secondary | ICD-10-CM | POA: Diagnosis not present

## 2024-08-15 DIAGNOSIS — E039 Hypothyroidism, unspecified: Secondary | ICD-10-CM | POA: Diagnosis not present

## 2024-08-15 DIAGNOSIS — K580 Irritable bowel syndrome with diarrhea: Secondary | ICD-10-CM | POA: Diagnosis not present

## 2024-08-15 DIAGNOSIS — N951 Menopausal and female climacteric states: Secondary | ICD-10-CM | POA: Diagnosis not present

## 2024-08-15 DIAGNOSIS — E559 Vitamin D deficiency, unspecified: Secondary | ICD-10-CM | POA: Diagnosis not present

## 2024-09-04 ENCOUNTER — Other Ambulatory Visit: Payer: Self-pay | Admitting: Nurse Practitioner

## 2024-09-04 DIAGNOSIS — N958 Other specified menopausal and perimenopausal disorders: Secondary | ICD-10-CM | POA: Diagnosis not present

## 2024-09-04 DIAGNOSIS — N6310 Unspecified lump in the right breast, unspecified quadrant: Secondary | ICD-10-CM

## 2024-09-04 DIAGNOSIS — N951 Menopausal and female climacteric states: Secondary | ICD-10-CM | POA: Diagnosis not present

## 2024-09-04 DIAGNOSIS — F520 Hypoactive sexual desire disorder: Secondary | ICD-10-CM | POA: Diagnosis not present

## 2024-09-05 ENCOUNTER — Ambulatory Visit: Admitting: Nurse Practitioner

## 2024-09-16 ENCOUNTER — Ambulatory Visit
Admission: RE | Admit: 2024-09-16 | Discharge: 2024-09-16 | Disposition: A | Discharge status: Home / Self Care | Source: Ambulatory Visit | Attending: Nurse Practitioner | Admitting: Nurse Practitioner

## 2024-09-16 ENCOUNTER — Other Ambulatory Visit: Payer: Self-pay | Admitting: Nurse Practitioner

## 2024-09-16 DIAGNOSIS — R928 Other abnormal and inconclusive findings on diagnostic imaging of breast: Secondary | ICD-10-CM | POA: Diagnosis not present

## 2024-09-16 DIAGNOSIS — N6310 Unspecified lump in the right breast, unspecified quadrant: Secondary | ICD-10-CM

## 2024-09-16 DIAGNOSIS — N6489 Other specified disorders of breast: Secondary | ICD-10-CM | POA: Diagnosis not present

## 2024-11-17 ENCOUNTER — Encounter: Payer: Self-pay | Admitting: *Deleted

## 2024-12-29 ENCOUNTER — Encounter

## 2024-12-29 ENCOUNTER — Other Ambulatory Visit
